# Patient Record
Sex: Male | Born: 1940 | Race: White | Hispanic: No | Marital: Married | Smoking: Former smoker
Health system: Southern US, Community
[De-identification: ages and names within clinical notes are randomized; demographics above are authoritative.]

## PROBLEM LIST (undated history)

## (undated) DIAGNOSIS — D497 Neoplasm of unspecified behavior of endocrine glands and other parts of nervous system: Secondary | ICD-10-CM

## (undated) DIAGNOSIS — E8881 Metabolic syndrome: Secondary | ICD-10-CM

## (undated) DIAGNOSIS — M5136 Other intervertebral disc degeneration, lumbar region: Secondary | ICD-10-CM

## (undated) DIAGNOSIS — E1140 Type 2 diabetes mellitus with diabetic neuropathy, unspecified: Secondary | ICD-10-CM

## (undated) DIAGNOSIS — Z9081 Acquired absence of spleen: Secondary | ICD-10-CM

## (undated) DIAGNOSIS — I480 Paroxysmal atrial fibrillation: Secondary | ICD-10-CM

## (undated) DIAGNOSIS — R4189 Other symptoms and signs involving cognitive functions and awareness: Secondary | ICD-10-CM

## (undated) DIAGNOSIS — I872 Venous insufficiency (chronic) (peripheral): Secondary | ICD-10-CM

## (undated) DIAGNOSIS — I82A22 Chronic embolism and thrombosis of left axillary vein: Secondary | ICD-10-CM

## (undated) DIAGNOSIS — E66811 Obesity, class 1: Secondary | ICD-10-CM

## (undated) DIAGNOSIS — M869 Osteomyelitis, unspecified: Secondary | ICD-10-CM

## (undated) DIAGNOSIS — N289 Disorder of kidney and ureter, unspecified: Secondary | ICD-10-CM

## (undated) DIAGNOSIS — G4730 Sleep apnea, unspecified: Secondary | ICD-10-CM

## (undated) DIAGNOSIS — J449 Chronic obstructive pulmonary disease, unspecified: Secondary | ICD-10-CM

## (undated) DIAGNOSIS — Z87898 Personal history of other specified conditions: Secondary | ICD-10-CM

## (undated) DIAGNOSIS — E669 Obesity, unspecified: Secondary | ICD-10-CM

## (undated) DIAGNOSIS — D803 Selective deficiency of immunoglobulin G [IgG] subclasses: Secondary | ICD-10-CM

## (undated) DIAGNOSIS — M1990 Unspecified osteoarthritis, unspecified site: Secondary | ICD-10-CM

## (undated) DIAGNOSIS — D849 Immunodeficiency, unspecified: Secondary | ICD-10-CM

## (undated) DIAGNOSIS — N400 Enlarged prostate without lower urinary tract symptoms: Secondary | ICD-10-CM

## (undated) DIAGNOSIS — T783XXA Angioneurotic edema, initial encounter: Secondary | ICD-10-CM

## (undated) DIAGNOSIS — I10 Essential (primary) hypertension: Secondary | ICD-10-CM

## (undated) DIAGNOSIS — M51369 Other intervertebral disc degeneration, lumbar region without mention of lumbar back pain or lower extremity pain: Secondary | ICD-10-CM

## (undated) HISTORY — PX: HERNIA REPAIR: SHX51

## (undated) HISTORY — PX: TONSILLECTOMY: SUR1361

## (undated) HISTORY — DX: Disorder of kidney and ureter, unspecified: N28.9

## (undated) HISTORY — DX: Angioneurotic edema, initial encounter: T78.3XXA

## (undated) HISTORY — DX: Chronic obstructive pulmonary disease, unspecified: J44.9

## (undated) HISTORY — DX: Type 2 diabetes mellitus with diabetic neuropathy, unspecified: E11.40

## (undated) HISTORY — DX: Paroxysmal atrial fibrillation: I48.0

## (undated) HISTORY — DX: Osteomyelitis, unspecified: M86.9

## (undated) HISTORY — PX: APPENDECTOMY: SHX54

## (undated) HISTORY — DX: Other symptoms and signs involving cognitive functions and awareness: R41.89

## (undated) HISTORY — DX: Obesity, class 1: E66.811

## (undated) HISTORY — PX: PAIN PUMP IMPLANTATION: SHX330

## (undated) HISTORY — DX: Unspecified osteoarthritis, unspecified site: M19.90

## (undated) HISTORY — DX: Neoplasm of unspecified behavior of endocrine glands and other parts of nervous system: D49.7

## (undated) HISTORY — DX: Sleep apnea, unspecified: G47.30

## (undated) HISTORY — PX: LUMBAR DISC SURGERY: SHX700

## (undated) HISTORY — DX: Benign prostatic hyperplasia without lower urinary tract symptoms: N40.0

## (undated) HISTORY — DX: Immunodeficiency, unspecified: D84.9

## (undated) HISTORY — PX: TEMPORAL ARTERY BIOPSY / LIGATION: SUR132

## (undated) HISTORY — DX: Other intervertebral disc degeneration, lumbar region without mention of lumbar back pain or lower extremity pain: M51.369

## (undated) HISTORY — PX: GALLBLADDER SURGERY: SHX652

## (undated) HISTORY — DX: Chronic embolism and thrombosis of left axillary vein: I82.A22

## (undated) HISTORY — DX: Metabolic syndrome: E88.810

## (undated) HISTORY — DX: Metabolic syndrome: E88.81

## (undated) HISTORY — DX: Obesity, unspecified: E66.9

## (undated) HISTORY — DX: Selective deficiency of immunoglobulin g (igg) subclasses: D80.3

## (undated) HISTORY — DX: Other intervertebral disc degeneration, lumbar region: M51.36

## (undated) HISTORY — DX: Essential (primary) hypertension: I10

## (undated) HISTORY — DX: Personal history of other specified conditions: Z87.898

## (undated) HISTORY — DX: Venous insufficiency (chronic) (peripheral): I87.2

## (undated) HISTORY — PX: SPLENECTOMY, TOTAL: SHX788

## (undated) HISTORY — DX: Acquired absence of spleen: Z90.81

---

## 2015-09-21 DIAGNOSIS — M869 Osteomyelitis, unspecified: Secondary | ICD-10-CM

## 2015-09-21 DIAGNOSIS — I82A22 Chronic embolism and thrombosis of left axillary vein: Secondary | ICD-10-CM

## 2015-09-21 HISTORY — DX: Osteomyelitis, unspecified: M86.9

## 2015-09-21 HISTORY — DX: Chronic embolism and thrombosis of left axillary vein: I82.A22

## 2017-09-20 HISTORY — PX: TRANSTHORACIC ECHOCARDIOGRAM: SHX275

## 2017-09-20 HISTORY — PX: NM MYOVIEW LTD: HXRAD82

## 2018-03-07 ENCOUNTER — Encounter: Payer: Self-pay | Admitting: Cardiology

## 2018-05-26 ENCOUNTER — Ambulatory Visit: Payer: Medicare Other | Admitting: Allergy

## 2018-05-29 ENCOUNTER — Ambulatory Visit: Payer: Medicare Other | Admitting: Allergy and Immunology

## 2018-06-06 ENCOUNTER — Ambulatory Visit: Payer: Medicare Other | Admitting: Allergy and Immunology

## 2018-06-07 ENCOUNTER — Ambulatory Visit (INDEPENDENT_AMBULATORY_CARE_PROVIDER_SITE_OTHER): Payer: Medicare Other | Admitting: Allergy & Immunology

## 2018-06-07 ENCOUNTER — Encounter: Payer: Self-pay | Admitting: Allergy & Immunology

## 2018-06-07 VITALS — BP 166/80 | HR 74 | Temp 97.9°F | Resp 20 | Ht 63.0 in | Wt 214.0 lb

## 2018-06-07 DIAGNOSIS — J449 Chronic obstructive pulmonary disease, unspecified: Secondary | ICD-10-CM

## 2018-06-07 DIAGNOSIS — J310 Chronic rhinitis: Secondary | ICD-10-CM | POA: Insufficient documentation

## 2018-06-07 DIAGNOSIS — D803 Selective deficiency of immunoglobulin G [IgG] subclasses: Secondary | ICD-10-CM | POA: Diagnosis not present
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
## 2018-06-07 NOTE — Patient Instructions (Signed)
1. IgG1 subclass deficiency - We will get an IgG level today as well as a complete blood count today.  - We will work with [place] (one of our staff members who manages the infusions) to get you immunoglobulin since it has been so long.  - She will reach out to you tomorrow to get you set up with IVIG (hopefully we can do this soon).  2. Chronic obstructive pulmonary disease - Lung function looked somewhat terrible today. - We will continue with Trelegy one puff once daily. - You definitely need to see a Pulmonologist, but I will make sure that [facility] has referred you.   3. Chronic rhinitis - We will get labs to look for environmental allergies. - We will call you in 1-2 weeks with the results of the testing.  - Try using Nasacort 1-2 sprays per nostril daily.   4. Return in about 3 months (around 09/06/2018).   We will also talk to [facility] to make sure that all of your referrals have been made. We need to get you established with specialists.      Please inform us of any Emergency Department visits, hospitalizations, or changes in symptoms. Call us before going to the ED for breathing or allergy symptoms since we might be able to fit you in for a sick visit. Feel free to contact us anytime with any questions, problems, or concerns.  It was a pleasure to meet you and your family today!  Websites that have reliable patient information: 1. American Academy of Asthma, Allergy, and Immunology: www.aaaai.org 2. Food Allergy Research and Education (FARE): foodallergy.org 3. Mothers of Asthmatics: http://www.asthmacommunitynetwork.org 4. American College of Allergy, Asthma, and Immunology: MonthlyElectricBill.co.uk   Make sure you are registered to vote! If you have moved or changed any of your contact information, you will need to get this updated before voting!

## 2018-06-08 NOTE — Progress Notes (Addendum)
Lelan Pons, one of our front desk workers, talked to you [employer].  None of the referrals to his subspecialist have been made aside from myself and orthopedic surgery.  For the good of the patient's health, I think we need to get him tied into specialist here since he moved here from [place].  I would recommend that Dr. Keenan Bachelor make referrals to Cardiology, Neurology, Pulmonology, and Podiatry.  He saw all of the specialists in [place] and for continuity of care needs to see them here in [place].  Previous Cardiologist: Dr. Legrand Como [facility]) Previous Neurologist: Dr. Dietrich Pates ([place]) Previous Pulmonologist: Dr. Shelton Silvas ([place]) Previous Podiatrist: Dr. Clyde Canterbury ([place])    Salvatore Marvel, MD [facility]

## 2018-06-08 NOTE — Progress Notes (Signed)
FAXED note to Dr Keenan Bachelor Attached list of [facility] special[facility] providers for her to refer to for the patient to establish care with

## 2018-06-10 ENCOUNTER — Encounter (HOSPITAL_COMMUNITY): Payer: Self-pay | Admitting: Nurse Practitioner

## 2018-06-10 ENCOUNTER — Emergency Department (HOSPITAL_COMMUNITY)
Admission: EM | Admit: 2018-06-10 | Discharge: 2018-06-11 | Disposition: A | Discharge status: Home / Self Care | Payer: Medicare Other | Attending: Emergency Medicine | Admitting: Emergency Medicine

## 2018-06-10 DIAGNOSIS — R109 Unspecified abdominal pain: Secondary | ICD-10-CM

## 2018-06-10 DIAGNOSIS — J449 Chronic obstructive pulmonary disease, unspecified: Secondary | ICD-10-CM | POA: Insufficient documentation

## 2018-06-10 DIAGNOSIS — Z79899 Other long term (current) drug therapy: Secondary | ICD-10-CM | POA: Insufficient documentation

## 2018-06-10 DIAGNOSIS — Z87891 Personal history of nicotine dependence: Secondary | ICD-10-CM | POA: Insufficient documentation

## 2018-06-10 DIAGNOSIS — R112 Nausea with vomiting, unspecified: Secondary | ICD-10-CM | POA: Insufficient documentation

## 2018-06-10 DIAGNOSIS — Z7901 Long term (current) use of anticoagulants: Secondary | ICD-10-CM | POA: Insufficient documentation

## 2018-06-10 DIAGNOSIS — R42 Dizziness and giddiness: Secondary | ICD-10-CM

## 2018-06-10 DIAGNOSIS — Z794 Long term (current) use of insulin: Secondary | ICD-10-CM | POA: Diagnosis not present

## 2018-06-10 LAB — COMPREHENSIVE METABOLIC PANEL
ALBUMIN: 2.9 g/dL — AB (ref 3.5–5.0)
ALT: 15 U/L (ref 0–44)
AST: 18 U/L (ref 15–41)
Alkaline Phosphatase: 82 U/L (ref 38–126)
Anion gap: 11 (ref 5–15)
BUN: 39 mg/dL — ABNORMAL HIGH (ref 8–23)
CALCIUM: 9 mg/dL (ref 8.9–10.3)
CHLORIDE: 103 mmol/L (ref 98–111)
CO2: 25 mmol/L (ref 22–32)
Creatinine, Ser: 2.01 mg/dL — ABNORMAL HIGH (ref 0.61–1.24)
GFR calc non Af Amer: 31 mL/min — ABNORMAL LOW (ref >60)
GFR, EST AFRICAN AMERICAN: 35 mL/min — AB (ref >60)
Glucose, Bld: 192 mg/dL — ABNORMAL HIGH (ref 70–99)
Potassium: 4.4 mmol/L (ref 3.5–5.1)
SODIUM: 139 mmol/L (ref 135–145)
Total Bilirubin: 0.4 mg/dL (ref 0.3–1.2)
Total Protein: 6.3 g/dL — ABNORMAL LOW (ref 6.5–8.1)

## 2018-06-10 LAB — URINALYSIS, ROUTINE W REFLEX MICROSCOPIC
BILIRUBIN URINE: NEGATIVE
Bacteria, UA: NONE SEEN
Glucose, UA: 50 mg/dL — AB
HGB URINE DIPSTICK: NEGATIVE
KETONES UR: NEGATIVE mg/dL
LEUKOCYTES UA: NEGATIVE
Nitrite: NEGATIVE
Protein, ur: 100 mg/dL — AB
Specific Gravity, Urine: 1.008 (ref 1.005–1.030)
pH: 6 (ref 5.0–8.0)

## 2018-06-10 LAB — CBC
HCT: 33.3 % — ABNORMAL LOW (ref 39.0–52.0)
Hemoglobin: 10.9 g/dL — ABNORMAL LOW (ref 13.0–17.0)
MCH: 32.5 pg (ref 26.0–34.0)
MCHC: 32.7 g/dL (ref 30.0–36.0)
MCV: 99.4 fL (ref 78.0–100.0)
PLATELETS: 287 10*3/uL (ref 150–400)
RBC: 3.35 MIL/uL — AB (ref 4.22–5.81)
RDW: 15.5 % (ref 11.5–15.5)
WBC: 11.8 10*3/uL — ABNORMAL HIGH (ref 4.0–10.5)

## 2018-06-10 NOTE — ED Triage Notes (Signed)
Pt presents from [facility] [place], he is c/o abdominal pain, dizziness, nausea vomiting that he states started about an hour ago when he ate fish. Denies any other symptoms

## 2018-06-10 NOTE — ED Notes (Signed)
Bed: UP73 Expected date:  Expected time:  Means of arrival:  Comments: 77 yo M/ ABD PAIN

## 2018-06-11 ENCOUNTER — Encounter (HOSPITAL_COMMUNITY): Payer: Self-pay | Admitting: Nurse Practitioner

## 2018-06-11 ENCOUNTER — Emergency Department (HOSPITAL_COMMUNITY): Payer: Medicare Other

## 2018-06-11 DIAGNOSIS — R112 Nausea with vomiting, unspecified: Secondary | ICD-10-CM | POA: Diagnosis not present

## 2018-06-11 LAB — I-STAT CHEM 8, ED
BUN: 48 mg/dL — ABNORMAL HIGH (ref 8–23)
CREATININE: 1.9 mg/dL — AB (ref 0.61–1.24)
Calcium, Ion: 1.18 mmol/L (ref 1.15–1.40)
Chloride: 102 mmol/L (ref 98–111)
Glucose, Bld: 192 mg/dL — ABNORMAL HIGH (ref 70–99)
HEMATOCRIT: 33 % — AB (ref 39.0–52.0)
HEMOGLOBIN: 11.2 g/dL — AB (ref 13.0–17.0)
Potassium: 5.2 mmol/L — ABNORMAL HIGH (ref 3.5–5.1)
Sodium: 135 mmol/L (ref 135–145)
TCO2: 27 mmol/L (ref 22–32)

## 2018-06-11 LAB — I-STAT TROPONIN, ED: Troponin i, poc: 0.01 ng/mL (ref 0.00–0.08)

## 2018-06-11 MED ORDER — MORPHINE SULFATE (PF) 4 MG/ML IV SOLN
4.0000 mg | Freq: Once | INTRAVENOUS | Status: AC
  Administered 2018-06-11: 4 mg via INTRAVENOUS
  Filled 2018-06-11: qty 1

## 2018-06-11 MED ORDER — IOPAMIDOL (ISOVUE-300) INJECTION 61%
INTRAVENOUS | Status: AC
  Filled 2018-06-11: qty 100

## 2018-06-11 MED ORDER — SODIUM CHLORIDE 0.9 % IV BOLUS
250.0000 mL | Freq: Once | INTRAVENOUS | Status: AC
  Administered 2018-06-11: 250 mL via INTRAVENOUS

## 2018-06-11 MED ORDER — IOPAMIDOL (ISOVUE-300) INJECTION 61%
80.0000 mL | Freq: Once | INTRAVENOUS | Status: AC | PRN
  Administered 2018-06-11: 80 mL via INTRAVENOUS

## 2018-06-11 MED ORDER — ONDANSETRON 4 MG PO TBDP: 4.0000 mg | ORAL_TABLET | Freq: Three times a day (TID) | ORAL | 0 refills | Status: DC | PRN

## 2018-06-11 MED ORDER — SODIUM CHLORIDE 0.9 % IJ SOLN
INTRAMUSCULAR | Status: AC
  Filled 2018-06-11: qty 50

## 2018-06-11 MED ORDER — ONDANSETRON HCL 4 MG/2ML IJ SOLN
4.0000 mg | Freq: Once | INTRAMUSCULAR | Status: AC
  Administered 2018-06-11: 4 mg via INTRAVENOUS
  Filled 2018-06-11: qty 2

## 2018-06-11 NOTE — ED Notes (Signed)
Patient transported to CT 

## 2018-06-11 NOTE — ED Notes (Signed)
Pt gave permission to sign for him

## 2018-06-11 NOTE — Discharge Instructions (Addendum)
No emergent cause of vomiting or abdominal pain was found.  If the symptoms worsen, or you have bloody vomit/diarrhea, or fever please return to the ER.  You CT has some concerning findings for possible metastatic disease.  Perhaps stemming from the mass in your leg.  You need to see a cancer doctor.  Please contact the provider listed.   IMPRESSION: 1. No acute intra-abdominal or pelvic pathology. 2. Colonic diverticulosis. No bowel obstruction or active inflammation. 3. Small nodular densities along the inferior surface of the left hemidiaphragm may represent lymph nodes, or residual splenic tissue. Metastatic implants are not excluded. Correlation with history of known malignancy recommended. 4. Large fat containing lesion in the medial musculature of the left proximal thigh. Further characterization with MRI is recommended.

## 2018-06-11 NOTE — ED Provider Notes (Signed)
[facility] DEPT Provider Note   CSN: [id number] Arrival date & time: 06/10/18  2235     History   Chief Complaint Chief Complaint  Patient presents with  . Dizziness  . Emesis  . Nausea    HPI Jeffery [name] is a 77 y.o. male.  Patient presents to the emergency department with a chief complaint of nausea, vomiting, dizziness.  He states that a couple of hours ago he felt dizzy and vomited.  He states that the dizziness is resolved.  He reports having some abdominal pain.  He reports multiple prior abdominal surgeries.  He denies any chest pain or shortness of breath.  Denies any fever, chills, cough.  He states that he ate fish for dinner tonight.  He has had Zofran by EMS.  He still reports feeling nauseated and having some left-sided abdominal pain.  The history is provided by the patient. No language interpreter was used.    Past Medical History:  Diagnosis Date  . Angio-edema     Patient Active Problem List   Diagnosis Date Noted  . 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 Chronic rhinitis 06/07/2018    Past Surgical History:  Procedure Laterality Date  . APPENDECTOMY    . GALLBLADDER SURGERY    . HERNIA REPAIR    . PAIN PUMP IMPLANTATION    . SPLENECTOMY, TOTAL    . TONSILLECTOMY          Home Medications    Prior to Admission medications   Medication Sig Start Date End Date Taking? Authorizing Provider  allopurinol (ZYLOPRIM) 300 MG tablet Take 300 mg by mouth daily.  05/10/18   [provider]  amLODipine (NORVASC) 5 MG tablet Take 5 mg by mouth daily.  04/08/18   [provider]  atorvastatin (LIPITOR) 10 MG tablet Take 10 mg by mouth daily.  04/07/18   [provider]  calcitRIOL (ROCALTROL) 0.25 MCG capsule Take 0.25 mcg by mouth daily.  03/31/18   [provider]  Docusate Sodium (COLACE PO) Take 100 mg by mouth 2 (two) times daily  as needed.     [provider]  donepezil (ARICEPT) 10 MG tablet Take 10 mg by mouth at bedtime.  04/23/18   [provider]  finasteride (PROSCAR) 5 MG tablet Take 5 mg by mouth daily.  03/09/18   [provider]  gabapentin (NEURONTIN) 300 MG capsule Take 300 mg by mouth 3 (three) times daily.  05/29/18   [provider]  GAMMAGARD 20 GM/200ML SOLN  05/01/18   [provider]  hydrochlorothiazide (MICROZIDE) 12.5 MG capsule Take 12.5 mg by mouth daily.  04/21/18   [provider]  hyoscyamine (LEVSIN, ANASPAZ) 0.125 MG tablet Take 0.125 mg by mouth every 6 (six) hours as needed.  04/07/18   [provider]  insulin glargine (LANTUS) 100 UNIT/ML injection Inject 30 Units into the skin every morning.    [provider]  insulin lispro (HUMALOG KWIKPEN) 100 UNIT/ML KiwkPen Inject into the skin See admin instructions. Sliding scale. 1-4 times daily.    [provider]  losartan (COZAAR) 100 MG tablet Take 100 mg by mouth daily.  04/21/18   [provider]  methocarbamol (ROBAXIN) 500 MG tablet  04/07/18   [provider]  metoprolol succinate (TOPROL-XL) 25 MG 24 hr tablet Take 25 mg by mouth daily.  03/09/18   [provider]  montelukast (SINGULAIR) 10  MG tablet Take 10 mg by mouth at bedtime.    [provider]  oxyCODONE (ROXICODONE) 15 MG immediate release tablet Take 15 mg by mouth every 6 (six) hours as needed.  05/10/18   [provider]  OXYCONTIN 15 MG 12 hr tablet Take 15 mg by mouth every 12 (twelve) hours.  05/10/18   [provider]  ranitidine (ZANTAC) 150 MG tablet  03/24/18   [provider]  tamsulosin (FLOMAX) 0.4 MG CAPS capsule Take 0.4 mg by mouth daily.  04/07/18   [provider]  XARELTO 15 MG TABS tablet  04/07/18   [provider]    Family History History reviewed. No pertinent family history.  Social History Social History    Tobacco Use  . Smoking status: Former Smoker    Last attempt to quit: 06/07/1988    Years since quitting: 30.0  . Smokeless tobacco: Never Used  Substance Use Topics  . Alcohol use: Not on file  . Drug use: Not on file     Allergies   Betadine [povidone iodine]; Keflex [cephalexin]; and Penicillins   Review of Systems Review of Systems  All other systems reviewed and are negative.    Physical Exam Updated Vital Signs BP (!) 151/72 (BP Location: Right Arm)   Pulse 68   Temp 98.4 F (36.9 C) (Oral)   Resp 14   SpO2 96%   Physical Exam  Constitutional: He is oriented to person, place, and time. He appears well-developed and well-nourished.  HENT:  Head: Normocephalic and atraumatic.  Eyes: Pupils are equal, round, and reactive to light. Conjunctivae and EOM are normal. Right eye exhibits no discharge. Left eye exhibits no discharge. No scleral icterus.  Neck: Normal range of motion. Neck supple. No JVD present.  Cardiovascular: Normal rate, regular rhythm and normal heart sounds. Exam reveals no gallop and no friction rub.  No murmur heard. Pulmonary/Chest: Effort normal and breath sounds normal. No respiratory distress. He has no wheezes. He has no rales. He exhibits no tenderness.  Abdominal: Soft. He exhibits no distension and no mass. There is tenderness. There is no rebound and no guarding.  Musculoskeletal: Normal range of motion. He exhibits no edema or tenderness.  Neurological: He is alert and oriented to person, place, and time.  Skin: Skin is warm and dry.  Psychiatric: He has a normal mood and affect. His behavior is normal. Judgment and thought content normal.  Nursing note and vitals reviewed.    ED Treatments / Results  Labs (all labs ordered are listed, but only abnormal results are displayed) Labs Reviewed  CBC - Abnormal; Notable for the following components:      Result Value   WBC 11.8 (*)    RBC 3.35 (*)    Hemoglobin 10.9 (*)    HCT 33.3  (*)    All other components within normal limits  URINALYSIS, ROUTINE W REFLEX MICROSCOPIC - Abnormal; Notable for the following components:   Color, Urine STRAW (*)    Glucose, UA 50 (*)    Protein, ur 100 (*)    All other components within normal limits  COMPREHENSIVE METABOLIC PANEL - Abnormal; Notable for the following components:   Glucose, Bld 192 (*)    BUN 39 (*)    Creatinine, Ser 2.01 (*)    Total Protein 6.3 (*)    Albumin 2.9 (*)    GFR calc non Af Amer 31 (*)    GFR calc Af Amer 35 (*)  All other components within normal limits  I-STAT CHEM 8, ED  I-STAT TROPONIN, ED    EKG None  Radiology Ct Abdomen Pelvis W Contrast  Result Date: 06/11/2018 CLINICAL DATA:  77 year old male with abdominal pain. EXAM: CT ABDOMEN AND PELVIS WITH CONTRAST TECHNIQUE: Multidetector CT imaging of the abdomen and pelvis was performed using the standard protocol following bolus administration of intravenous contrast. CONTRAST:  27mL ISOVUE-300 IOPAMIDOL (ISOVUE-300) INJECTION 61% COMPARISON:  None. FINDINGS: Lower chest: Bilateral posterior pleural thickening or subpleural scarring. There is mild bronchiectatic changes of the visualized lung bases as well as diffuse interstitial prominence and thickening. Coronary vascular calcifications noted. No intra-abdominal free air or free fluid. Hepatobiliary: Cholecystectomy. The liver is unremarkable. There is mild intrahepatic biliary ductal dilatation, likely post cholecystectomy. No retained calcified stone noted in the central CBD. Pancreas: Unremarkable. No pancreatic ductal dilatation or surrounding inflammatory changes. Spleen: Splenectomy. Adrenals/Urinary Tract: The adrenal glands are unremarkable. Multiple small left renal hypodense lesions measure up to 2 cm in the interpolar aspect of the left kidney. These lesions are suboptimally characterized but the larger lesions demonstrate fluid attenuation most consistent with cysts and the smaller  lesions are too small to characterize. There is no hydronephrosis on either side. The visualized ureters and urinary bladder appear unremarkable. Stomach/Bowel: There is sigmoid diverticulosis without active inflammatory changes. Moderate stool noted throughout the colon. There is no bowel obstruction or active inflammation. Appendectomy. Thickened appearance of the rectum likely related to underdistention. No associated inflammatory changes. Vascular/Lymphatic: Advanced aortoiliac atherosclerotic disease. The IVC is unremarkable. No portal venous gas. Top-normal retroperitoneal lymph nodes no adenopathy. Small nodular density along the surface of the left hemidiaphragm measure up to 10 mm (coronal series 5, image 60) are indeterminate but may represent small lymph nodes or splenic tissue. If there is history of known malignancy, metastatic implants are not excluded. Reproductive: The prostate and seminal vesicles are grossly unremarkable. Other: There is a 6 x 10 cm partially visualized fat containing lesion in the left pelvic musculature. This is incompletely characterized. Further characterization with MRI without and with contrast on a nonemergent basis recommended. A generator device is noted in the subcutaneous soft tissues of the anterior abdominal wall. There is a large pannus containing loops of small bowel without evidence of obstruction. Musculoskeletal: Osteopenia with multilevel degenerative changes of the spine most prominent at T12-L2. No acute osseous pathology. IMPRESSION: 1. No acute intra-abdominal or pelvic pathology. 2. Colonic diverticulosis. No bowel obstruction or active inflammation. 3. Small nodular densities along the inferior surface of the left hemidiaphragm may represent lymph nodes, or residual splenic tissue. Metastatic implants are not excluded. Correlation with history of known malignancy recommended. 4. Large fat containing lesion in the medial musculature of the left proximal  thigh. Further characterization with MRI is recommended. Electronically Signed   By: Anner Crete M.D.   On: 06/11/2018 02:11    Procedures Procedures (including critical care time)  Medications Ordered in ED Medications  sodium chloride 0.9 % bolus 250 mL (has no administration in time range)  ondansetron (ZOFRAN) injection 4 mg (has no administration in time range)  morphine 4 MG/ML injection 4 mg (has no administration in time range)     Initial Impression / Assessment and Plan / ED Course  I have reviewed the triage vital signs and the nursing notes.  Pertinent labs & imaging results that were available during my care of the patient were reviewed by me and considered in my medical decision making (see  chart for details).     Patient with abdominal pain, nausea, vomiting.  States that he had a brief episode of dizziness immediately prior to the episode of vomiting.  He has no dizziness now.  He does have some tenderness on exam, and has had multiple prior abdominal surgeries, given age and risk factors will proceed with CT imaging.  CT shows no acute intra-abdominal process, but there is some concern for metastatic disease.  Patient also has known hernias.  I informed the patient of the results, and he states that he was in the middle of an evaluation for a mass in his left thigh, but ended up moving to [place], and has not reestablished care.  I discussed the need to follow-up with oncology urgently.  Patient understands and agrees with the plan.  Patient discussed with Dr. Randal Buba, who agrees with plan.  Final Clinical Impressions(s) / ED Diagnoses   Final diagnoses:  Nausea and vomiting, intractability of vomiting not specified, unspecified vomiting type  Dizziness  Abdominal pain, unspecified abdominal location    ED Discharge Orders    None       Montine Circle, PA-C 06/11/18 0244    Palumbo, April, MD 06/11/18 [phone]

## 2018-06-12 LAB — CBC WITH DIFFERENTIAL/PLATELET
Basophils Absolute: 0.1 10*3/uL (ref 0.0–0.2)
Basos: 0 %
EOS (ABSOLUTE): 0.3 10*3/uL (ref 0.0–0.4)
EOS: 2 %
HEMATOCRIT: 35 % — AB (ref 37.5–51.0)
Hemoglobin: 11.6 g/dL — ABNORMAL LOW (ref 13.0–17.7)
IMMATURE GRANULOCYTES: 1 %
Immature Grans (Abs): 0.1 10*3/uL (ref 0.0–0.1)
Lymphocytes Absolute: 1.5 10*3/uL (ref 0.7–3.1)
Lymphs: 9 %
MCH: 32 pg (ref 26.6–33.0)
MCHC: 33.1 g/dL (ref 31.5–35.7)
MCV: 96 fL (ref 79–97)
MONOS ABS: 1.4 10*3/uL — AB (ref 0.1–0.9)
Monocytes: 9 %
NEUTROS PCT: 79 %
Neutrophils Absolute: 13 10*3/uL — ABNORMAL HIGH (ref 1.4–7.0)
Platelets: 296 10*3/uL (ref 150–450)
RBC: 3.63 x10E6/uL — AB (ref 4.14–5.80)
RDW: 14.2 % (ref 12.3–15.4)
WBC: 16.4 10*3/uL — AB (ref 3.4–10.8)

## 2018-06-12 LAB — CMP14+EGFR
A/G RATIO: 1.3 (ref 1.2–2.2)
ALK PHOS: 110 IU/L (ref 39–117)
ALT: 13 IU/L (ref 0–44)
AST: 15 IU/L (ref 0–40)
Albumin: 3.4 g/dL — ABNORMAL LOW (ref 3.5–4.8)
BUN/Creatinine Ratio: 17 (ref 10–24)
BUN: 31 mg/dL — ABNORMAL HIGH (ref 8–27)
Bilirubin Total: <0.2 mg/dL (ref 0.0–1.2)
CO2: 23 mmol/L (ref 20–29)
CREATININE: 1.8 mg/dL — AB (ref 0.76–1.27)
Calcium: 9.2 mg/dL (ref 8.6–10.2)
Chloride: 100 mmol/L (ref 96–106)
GFR calc Af Amer: 41 mL/min/{1.73_m2} — ABNORMAL LOW (ref >59)
GFR, EST NON AFRICAN AMERICAN: 36 mL/min/{1.73_m2} — AB (ref >59)
Globulin, Total: 2.7 g/dL (ref 1.5–4.5)
Glucose: 234 mg/dL — ABNORMAL HIGH (ref 65–99)
POTASSIUM: 4.4 mmol/L (ref 3.5–5.2)
SODIUM: 139 mmol/L (ref 134–144)
Total Protein: 6.1 g/dL (ref 6.0–8.5)

## 2018-06-12 LAB — IGE+ALLERGENS ZONE 2(30)
Alternaria Alternata IgE: <0.1 kU/L
Amer Sycamore IgE Qn: <0.1 kU/L
Bermuda Grass IgE: <0.1 kU/L
Cedar, Mountain IgE: <0.1 kU/L
Cladosporium Herbarum IgE: <0.1 kU/L
Cockroach, American IgE: <0.1 kU/L
D Farinae IgE: <0.1 kU/L
D Pteronyssinus IgE: <0.1 kU/L
Dog Dander IgE: <0.1 kU/L
Hickory, White IgE: <0.1 kU/L
IgE (Immunoglobulin E), Serum: 144 IU/mL (ref 6–495)
Maple/Box Elder IgE: <0.1 kU/L
Nettle IgE: <0.1 kU/L
Oak, White IgE: <0.1 kU/L
Penicillium Chrysogen IgE: <0.1 kU/L
Pigweed, Rough IgE: <0.1 kU/L
Sheep Sorrel IgE Qn: <0.1 kU/L

## 2018-06-12 LAB — IGG: IGG (IMMUNOGLOBIN G), SERUM: 680 mg/dL — AB (ref 700–1600)

## 2018-06-13 ENCOUNTER — Telehealth: Payer: Self-pay | Admitting: Allergy & Immunology

## 2018-06-13 ENCOUNTER — Telehealth: Payer: Self-pay | Admitting: *Deleted

## 2018-06-13 ENCOUNTER — Encounter: Payer: Self-pay | Admitting: *Deleted

## 2018-06-13 NOTE — Telephone Encounter (Signed)
Are lab results back?? Please call and advise pateint

## 2018-06-13 NOTE — Telephone Encounter (Signed)
I had it arranged for patient to get IVIG in home and when I spoke to patient and explained same previously he did not mention that he did not want to get in home infusion.  Upon nurse reaching out to patient he advised her that he was adamant about getting infusion in hospital.   I called and scheduled patient for infusion on first Monday or Wednesday (due to transportation) available which will be October 2 at 8am.  I did contact patient and advised appt and I will mail him reminder.  I also had to advised him I contacted [facility] and they are unable to obtain the Gammagard he was previously on so he will be getting Privigen instead.

## 2018-06-13 NOTE — Telephone Encounter (Signed)
That is fine. That Gammagard lady came by last week and made a big deal out of it being on formulary for [facility]. Oh well!   Salvatore Marvel, MD [facility] [place] [facility] [place]

## 2018-06-13 NOTE — Telephone Encounter (Signed)
Sorry did not advise lab tests do the issues of trying to get him started back on IVIG

## 2018-06-13 NOTE — Telephone Encounter (Signed)
Left detailed message requesting call back from wife or patient to discuss results.

## 2018-06-13 NOTE — Telephone Encounter (Signed)
Jeffery [name], I checked lab notes and am not sure if you spoke with him regarding his test results. Please advise and thank you.

## 2018-06-14 NOTE — Telephone Encounter (Signed)
I spoke to husband and advised him of this information. He then requested I tell the same to his wife, which I did. She states that the patient has an appointment with pcp (dr who makes house calls) this Friday. He is set up to get an infusion 06-21-2018.

## 2018-06-14 NOTE — Telephone Encounter (Signed)
FYI

## 2018-06-20 ENCOUNTER — Other Ambulatory Visit (HOSPITAL_COMMUNITY): Payer: Self-pay | Admitting: *Deleted

## 2018-06-21 ENCOUNTER — Encounter: Payer: Self-pay | Admitting: Allergy & Immunology

## 2018-06-21 ENCOUNTER — Ambulatory Visit (HOSPITAL_COMMUNITY)
Admission: RE | Admit: 2018-06-21 | Discharge: 2018-06-21 | Disposition: A | Discharge status: Home / Self Care | Payer: Medicare Other | Source: Ambulatory Visit | Attending: Allergy & Immunology | Admitting: Allergy & Immunology

## 2018-06-21 DIAGNOSIS — D803 Selective deficiency of immunoglobulin G [IgG] subclasses: Secondary | ICD-10-CM | POA: Diagnosis present

## 2018-06-21 MED ORDER — DIPHENHYDRAMINE HCL 25 MG PO CAPS
ORAL_CAPSULE | ORAL | Status: AC
  Administered 2018-06-21: 25 mg
  Filled 2018-06-21: qty 1

## 2018-06-21 MED ORDER — IMMUNE GLOBULIN (HUMAN) 20 GM/200ML IV SOLN
30.0000 g | Freq: Once | INTRAVENOUS | Status: AC
  Administered 2018-06-21: 30 g via INTRAVENOUS
  Filled 2018-06-21: qty 100

## 2018-06-21 MED ORDER — ACETAMINOPHEN 325 MG PO TABS
ORAL_TABLET | ORAL | Status: AC
  Administered 2018-06-21: 650 mg
  Filled 2018-06-21: qty 2

## 2018-06-21 MED ORDER — DEXTROSE 5 % IV SOLN
Freq: Once | INTRAVENOUS | Status: AC
  Administered 2018-06-21: 09:00:00 via INTRAVENOUS

## 2018-06-21 MED ORDER — DIPHENHYDRAMINE HCL 25 MG PO CAPS: 25.0000 mg | ORAL_CAPSULE | Freq: Once | ORAL | Status: DC

## 2018-06-21 MED ORDER — ACETAMINOPHEN 325 MG PO TABS: 650.0000 mg | ORAL_TABLET | Freq: Once | ORAL | Status: DC

## 2018-07-06 NOTE — Progress Notes (Signed)
Patient needs a Monday or Wednesday appts due to transportation Will try to switch to tuesdays if possible

## 2018-07-10 ENCOUNTER — Other Ambulatory Visit: Payer: Self-pay | Admitting: Orthopedic Surgery

## 2018-07-10 DIAGNOSIS — M545 Low back pain, unspecified: Secondary | ICD-10-CM

## 2018-07-12 ENCOUNTER — Ambulatory Visit: Payer: BLUE CROSS/BLUE SHIELD | Admitting: Cardiology

## 2018-07-17 ENCOUNTER — Other Ambulatory Visit: Payer: BLUE CROSS/BLUE SHIELD

## 2018-07-18 ENCOUNTER — Other Ambulatory Visit (HOSPITAL_COMMUNITY): Payer: Self-pay | Admitting: *Deleted

## 2018-07-19 ENCOUNTER — Encounter (HOSPITAL_COMMUNITY): Payer: BLUE CROSS/BLUE SHIELD

## 2018-07-19 ENCOUNTER — Telehealth: Payer: Self-pay | Admitting: *Deleted

## 2018-07-19 ENCOUNTER — Ambulatory Visit (HOSPITAL_COMMUNITY)
Admission: RE | Admit: 2018-07-19 | Discharge: 2018-07-19 | Disposition: A | Discharge status: Home / Self Care | Payer: Medicare Other | Source: Ambulatory Visit | Attending: Allergy & Immunology | Admitting: Allergy & Immunology

## 2018-07-19 DIAGNOSIS — D803 Selective deficiency of immunoglobulin G [IgG] subclasses: Secondary | ICD-10-CM | POA: Diagnosis present

## 2018-07-19 MED ORDER — ACETAMINOPHEN 325 MG PO TABS
ORAL_TABLET | ORAL | Status: AC
  Filled 2018-07-19: qty 2

## 2018-07-19 MED ORDER — ACETAMINOPHEN 325 MG PO TABS
650.0000 mg | ORAL_TABLET | ORAL | Status: DC
  Administered 2018-07-19: 650 mg via ORAL

## 2018-07-19 MED ORDER — DIPHENHYDRAMINE HCL 25 MG PO CAPS
ORAL_CAPSULE | ORAL | Status: AC
  Filled 2018-07-19: qty 1

## 2018-07-19 MED ORDER — DIPHENHYDRAMINE HCL 25 MG PO CAPS
25.0000 mg | ORAL_CAPSULE | ORAL | Status: DC
  Administered 2018-07-19: 25 mg via ORAL

## 2018-07-19 MED ORDER — IMMUNE GLOBULIN (HUMAN) 20 GM/200ML IV SOLN
30.0000 g | INTRAVENOUS | Status: DC
  Administered 2018-07-19: 30 g via INTRAVENOUS
  Filled 2018-07-19: qty 100

## 2018-07-19 NOTE — Telephone Encounter (Signed)
Sonia Baller from [facility] short stay called an advised that patient there for his IVIG infusion and wife advised that after last infusion patient developed the shakes and after talking to MD advised this infusion we would order hydration.  I looked in Epic and spoke to Dr Ernst Bowler and we both had not hear anything regarding this from patient or wife after infusion.  Per Dr Ernst Bowler we will go ahead with the ordered IVIG and had patient drink water while getting infused and Sonia Baller will advised both that if he experiences any issues to contact our office so that we may know if he is having any issues and go from there

## 2018-07-20 ENCOUNTER — Telehealth: Payer: Self-pay | Admitting: Allergy & Immunology

## 2018-07-20 NOTE — Telephone Encounter (Signed)
Patient is getting sick after infusions He gets dehydrated, shakes, nauseated Can an order be put in to hydrate him during infusion??

## 2018-07-21 NOTE — Telephone Encounter (Signed)
I will go ahead and add to new order and send to [facility] for next infusion. I dont think they will go for SCIG since they were so opposed to even IVIG  In home but will keep that as option if he doesn't do well with next IVIG

## 2018-07-21 NOTE — Telephone Encounter (Signed)
Reviewed note.  We can certainly add hydration order to his premedication regimen (500 mL normal saline).  If this is so much of an issue, maybe we should more strongly consider subcutaneous immunoglobulin.  We could switch him to Cuvitru every 2 weeks which he might tolerate better.  I will route a message to Tammy to see what she thinks.  Salvatore Marvel, MD [facility] and [place] [facility] [place]

## 2018-07-25 NOTE — Telephone Encounter (Signed)
That is an excellent point. We will see how the hydration goes.  Salvatore Marvel, MD [facility] [place] [facility] [place]

## 2018-07-26 ENCOUNTER — Ambulatory Visit
Admission: RE | Admit: 2018-07-26 | Discharge: 2018-07-26 | Disposition: A | Discharge status: Home / Self Care | Payer: Medicare Other | Source: Ambulatory Visit | Attending: Orthopedic Surgery | Admitting: Orthopedic Surgery

## 2018-07-26 DIAGNOSIS — M545 Low back pain, unspecified: Secondary | ICD-10-CM

## 2018-07-27 ENCOUNTER — Encounter: Payer: Self-pay | Admitting: Gastroenterology

## 2018-08-02 ENCOUNTER — Encounter: Payer: Self-pay | Admitting: Gastroenterology

## 2018-08-10 ENCOUNTER — Ambulatory Visit (INDEPENDENT_AMBULATORY_CARE_PROVIDER_SITE_OTHER): Payer: Medicare Other | Admitting: Emergency Medicine

## 2018-08-10 ENCOUNTER — Encounter: Payer: Self-pay | Admitting: Emergency Medicine

## 2018-08-10 ENCOUNTER — Other Ambulatory Visit: Payer: Self-pay

## 2018-08-10 VITALS — BP 161/66 | HR 60 | Temp 98.0°F | Ht 68.0 in | Wt 208.2 lb

## 2018-08-10 DIAGNOSIS — R1084 Generalized abdominal pain: Secondary | ICD-10-CM | POA: Diagnosis not present

## 2018-08-10 DIAGNOSIS — Z7689 Persons encountering health services in other specified circumstances: Secondary | ICD-10-CM

## 2018-08-10 DIAGNOSIS — L989 Disorder of the skin and subcutaneous tissue, unspecified: Secondary | ICD-10-CM

## 2018-08-10 MED ORDER — DICYCLOMINE HCL 20 MG PO TABS: 20.0000 mg | ORAL_TABLET | Freq: Three times a day (TID) | ORAL | 1 refills | Status: DC | PRN

## 2018-08-10 NOTE — Patient Instructions (Addendum)
     If you have lab work done today you will be contacted with your lab results within the next 2 weeks.  If you have not heard from [place] then please contact us. The fastest way to get your results is to register for My Chart.   IF you received an x-ray today, you will receive an invoice from [facility]. Please contact [facility] at [phone] with questions or concerns regarding your invoice.   IF you received labwork today, you will receive an invoice from [place]. Please contact [facility] at [phone] with questions or concerns regarding your invoice.   Our billing staff will not be able to assist you with questions regarding bills from these companies.  You will be contacted with the lab results as soon as they are available. The fastest way to get your results is to activate your My Chart account. Instructions are located on the last page of this paperwork. If you have not heard from [place] regarding the results in 2 weeks, please contact this office.     Abdominal Pain, Adult Many things can cause belly (abdominal) pain. Most times, belly pain is not dangerous. Many cases of belly pain can be watched and treated at home. Sometimes belly pain is serious, though. Your doctor will try to find the cause of your belly pain. Follow these instructions at home:  Take over-the-counter and prescription medicines only as told by your doctor. Do not take medicines that help you poop (laxatives) unless told to by your doctor.  Drink enough fluid to keep your pee (urine) clear or pale yellow.  Watch your belly pain for any changes.  Keep all follow-up visits as told by your doctor. This is important. Contact a doctor if:  Your belly pain changes or gets worse.  You are not hungry, or you lose weight without trying.  You are having trouble pooping (constipated) or have watery poop (diarrhea) for more than 2-3 days.  You have pain when you pee or poop.  Your belly pain  wakes you up at night.  Your pain gets worse with meals, after eating, or with certain foods.  You are throwing up and cannot keep anything down.  You have a fever. Get help right away if:  Your pain does not go away as soon as your doctor says it should.  You cannot stop throwing up.  Your pain is only in areas of your belly, such as the right side or the left lower part of the belly.  You have bloody or black poop, or poop that looks like tar.  You have very bad pain, cramping, or bloating in your belly.  You have signs of not having enough fluid or water in your body (dehydration), such as: ? Dark pee, very little pee, or no pee. ? Cracked lips. ? Dry mouth. ? Sunken eyes. ? Sleepiness. ? Weakness. This information is not intended to replace advice given to you by your health care provider. Make sure you discuss any questions you have with your health care provider. Document Released: 02/23/2008 Document Revised: 03/26/2016 Document Reviewed: 02/18/2016 Elsevier Interactive Patient Education  2018 [employer].

## 2018-08-10 NOTE — Progress Notes (Signed)
Jeffery [name] 77 y.o.   Chief Complaint  Patient presents with  . Establish Care    body itchy and stomach pains    HISTORY OF PRESENT ILLNESS: This is a 77 y.o. male complaining generalized abdominal pain for the past couple days.  Also feels in the abdominal area.  Still eating and drinking.  Denies nausea vomiting.  Denies diarrhea.  Denies fever or chills.  Patient has multiple chronic medical problems including chronic pain.  First visit to this office. CT scan of pelvis done on 07/26/2018 showed the following: IMPRESSION: No acute intrapelvic abnormalities.  Distal colonic diverticulosis without evidence of diverticulitis.  Scattered atherosclerotic calcifications.  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 CT scan of abdomen and pelvis done on 06/11/2018 showed the following: IMPRESSION: 1. No acute intra-abdominal or pelvic pathology. 2. Colonic diverticulosis. No bowel obstruction or active inflammation. 3. Small nodular densities along the inferior surface of the left hemidiaphragm may represent lymph nodes, or residual splenic tissue. Metastatic implants are not excluded. Correlation with history of known malignancy recommended. 4. Large fat containing lesion in the medial musculature of the left proximal thigh. Further characterization with MRI is recommended.  HPI   Prior to Admission medications   Medication Sig Start Date End Date Taking? Authorizing Provider  allopurinol (ZYLOPRIM) 300 MG tablet Take 300 mg by mouth daily.  05/10/18  Yes [provider]  amLODipine (NORVASC) 5 MG tablet Take 10 mg by mouth daily.  04/08/18  Yes [provider]  atorvastatin (LIPITOR) 10 MG tablet Take 10 mg by mouth daily.  04/07/18  Yes [provider]  calcitRIOL (ROCALTROL) 0.25 MCG capsule Take 0.25 mcg  by mouth daily.  03/31/18  Yes [provider]  Docusate Sodium (COLACE PO) Take 100 mg by mouth 2 (two) times daily as needed.    Yes [provider]  donepezil (ARICEPT) 10 MG tablet Take 10 mg by mouth at bedtime.  04/23/18  Yes [provider]  finasteride (PROSCAR) 5 MG tablet Take 5 mg by mouth daily.  03/09/18  Yes [provider]  gabapentin (NEURONTIN) 300 MG capsule Take 300 mg by mouth 3 (three) times daily.  05/29/18  Yes [provider]  GAMMAGARD 20 GM/200ML SOLN  05/01/18  Yes [provider]  hydrochlorothiazide (MICROZIDE) 12.5 MG capsule Take 12.5 mg by mouth daily.  04/21/18  Yes [provider]  hyoscyamine (LEVSIN, ANASPAZ) 0.125 MG tablet Take 0.125 mg by mouth every 6 (six) hours as needed.  04/07/18  Yes [provider]  insulin glargine (LANTUS) 100 UNIT/ML injection Inject 30 Units into the skin every morning.   Yes [provider]  insulin lispro (HUMALOG KWIKPEN) 100 UNIT/ML KiwkPen Inject into the skin See admin instructions. Sliding scale. 1-4 times daily.   Yes [provider]  losartan (COZAAR) 100 MG tablet Take 100 mg by mouth daily.  04/21/18  Yes [provider]  methocarbamol (ROBAXIN) 500 MG tablet  04/07/18  Yes [provider]  metoprolol succinate (TOPROL-XL) 25 MG 24 hr tablet Take 25 mg by mouth daily.  03/09/18  Yes [provider]  montelukast (SINGULAIR) 10 MG tablet Take 10 mg by mouth at bedtime.   Yes [provider]  ondansetron (ZOFRAN ODT) 4 MG disintegrating tablet Take 1 tablet (4 mg total) by mouth every 8 (eight)  hours as needed for nausea or vomiting. 06/11/18  Yes Montine Circle, PA-C  oxyCODONE (ROXICODONE) 15 MG immediate release tablet Take 15 mg by mouth every 6 (six) hours as needed.  05/10/18  Yes [provider]  OXYCONTIN 15 MG 12 hr tablet Take 15 mg by mouth every 12 (twelve) hours.  05/10/18  Yes [provider]  tamsulosin (FLOMAX) 0.4 MG CAPS capsule Take 0.4 mg by mouth daily.  04/07/18  Yes [provider]  XARELTO 15 MG TABS tablet  04/07/18  Yes [provider]    Allergies  Allergen Reactions  . Betadine [Povidone Iodine] Rash  . Keflex [Cephalexin] Rash  . Penicillins Rash    Patient Active Problem List   Diagnosis Date Noted  . 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 Chronic rhinitis 06/07/2018    Past Medical History:  Diagnosis Date  . Angio-edema     Past Surgical History:  Procedure Laterality Date  . APPENDECTOMY    . GALLBLADDER SURGERY    . HERNIA REPAIR    . PAIN PUMP IMPLANTATION    . SPLENECTOMY, TOTAL    . TONSILLECTOMY      Social History   Socioeconomic History  . Marital status: Married    Spouse name: Not on file  . Number of children: Not on file  . Years of education: Not on file  . Highest education level: Not on file  Occupational History  . Not on file  Social Needs  . Financial resource strain: Not on file  . Food insecurity:    Worry: Not on file    Inability: Not on file  . Transportation needs:    Medical: Not on file    Non-medical: Not on file  Tobacco Use  . Smoking status: Former Smoker    Last attempt to quit: 06/07/1988    Years since quitting: 30.1  . Smokeless tobacco: Never Used  Substance and Sexual Activity  . Alcohol use: Not on file  . Drug use: Not on file  . Sexual activity: Not on file  Lifestyle  . Physical activity:    Days per week: Not on file    Minutes per session: Not on file  . Stress: Not on file  Relationships  . Social connections:    Talks on phone: Not on file    Gets together: Not on file    Attends religious service: Not on file    Active member of club or organization: Not on file    Attends meetings of clubs or organizations: Not on file    Relationship status: Not on file  . Intimate partner violence:      Fear of current or ex partner: Not on file    Emotionally abused: Not on file    Physically abused: Not on file    Forced sexual activity: Not on file  Other Topics Concern  . Not on file  Social History Narrative  . Not on file    No family history on file.   Review of Systems  Constitutional: Negative.  Negative for chills and fever.  HENT: Negative.  Negative for sore throat.   Eyes: Negative.  Negative for blurred vision and double vision.  Respiratory: Negative.  Negative for cough and shortness of breath.   Cardiovascular: Negative.  Negative for chest pain and palpitations.  Gastrointestinal: Positive for abdominal pain. Negative for blood in stool, constipation, diarrhea, melena, nausea and  vomiting.  Genitourinary: Negative.  Negative for dysuria and hematuria.  Skin: Positive for itching.  Neurological: Negative.  Negative for dizziness and headaches.  Endo/Heme/Allergies: Negative.     Vitals:   08/10/18 1141  BP: (!) 161/66  Pulse: 60  Temp: 98 F (36.7 C)  SpO2: 97%    Physical Exam  Constitutional: He is oriented to person, place, and time. He appears well-developed and well-nourished.  HENT:  Head: Normocephalic and atraumatic.  Nose: Nose normal.  Mouth/Throat: Oropharynx is clear and moist.  Eyes: Pupils are equal, round, and reactive to light. Conjunctivae and EOM are normal.  Neck: Normal range of motion. Neck supple.  Cardiovascular: Normal rate and regular rhythm.  Pulmonary/Chest: Effort normal and breath sounds normal.  Abdominal: Soft. Bowel sounds are normal. He exhibits no distension. There is no tenderness.  Musculoskeletal:  Right big toe shows moderate size dark lesion on plantar surface  Neurological: He is alert and oriented to person, place, and time. No sensory deficit. He exhibits normal muscle tone.  Skin: Skin is warm and dry.  Vitals reviewed.    ASSESSMENT & PLAN: Generalized abdominal pain Clinically stable with stable  vital signs.  Benign abdominal examination.  No red signs or symptoms.  Has appointment to see GI doctor next week.  Will treat with Bentyl as needed.  Advised to go to the emergency room if symptoms worsen in the next 48 hours.  Blood work done today.   Jeffery [name] was seen today for establish care.  Diagnoses and all orders for this visit:  Generalized abdominal pain -     CBC with Differential/Platelet -     Comprehensive metabolic panel -     Amylase -     dicyclomine (BENTYL) 20 MG tablet; Take 1 tablet (20 mg total) by mouth 3 (three) times daily as needed for spasms.  Encounter to establish care  Foot lesion -     Ambulatory referral to Podiatry    Patient Instructions       If you have lab work done today you will be contacted with your lab results within the next 2 weeks.  If you have not heard from [place] then please contact us. The fastest way to get your results is to register for My Chart.   IF you received an x-ray today, you will receive an invoice from [facility]. Please contact [facility] at [phone] with questions or concerns regarding your invoice.   IF you received labwork today, you will receive an invoice from [place]. Please contact [facility] at [phone] with questions or concerns regarding your invoice.   Our billing staff will not be able to assist you with questions regarding bills from these companies.  You will be contacted with the lab results as soon as they are available. The fastest way to get your results is to activate your My Chart account. Instructions are located on the last page of this paperwork. If you have not heard from [place] regarding the results in 2 weeks, please contact this office.     Abdominal Pain, Adult Many things can cause belly (abdominal) pain. Most times, belly pain is not dangerous. Many cases of belly pain can be watched and treated at home. Sometimes belly pain is serious, though. Your doctor will try  to find the cause of your belly pain. Follow these instructions at home:  Take over-the-counter and prescription medicines only as told by your doctor. Do not take medicines that help you poop (laxatives)  unless told to by your doctor.  Drink enough fluid to keep your pee (urine) clear or pale yellow.  Watch your belly pain for any changes.  Keep all follow-up visits as told by your doctor. This is important. Contact a doctor if:  Your belly pain changes or gets worse.  You are not hungry, or you lose weight without trying.  You are having trouble pooping (constipated) or have watery poop (diarrhea) for more than 2-3 days.  You have pain when you pee or poop.  Your belly pain wakes you up at night.  Your pain gets worse with meals, after eating, or with certain foods.  You are throwing up and cannot keep anything down.  You have a fever. Get help right away if:  Your pain does not go away as soon as your doctor says it should.  You cannot stop throwing up.  Your pain is only in areas of your belly, such as the right side or the left lower part of the belly.  You have bloody or black poop, or poop that looks like tar.  You have very bad pain, cramping, or bloating in your belly.  You have signs of not having enough fluid or water in your body (dehydration), such as: ? Dark pee, very little pee, or no pee. ? Cracked lips. ? Dry mouth. ? Sunken eyes. ? Sleepiness. ? Weakness. This information is not intended to replace advice given to you by your health care provider. Make sure you discuss any questions you have with your health care provider. Document Released: 02/23/2008 Document Revised: 03/26/2016 Document Reviewed: 02/18/2016 Elsevier Interactive Patient Education  2018 Elsevier Inc.      Agustina Caroli, MD [facility]

## 2018-08-10 NOTE — Assessment & Plan Note (Signed)
Clinically stable with stable vital signs.  Benign abdominal examination.  No red signs or symptoms.  Has appointment to see GI doctor next week.  Will treat with Bentyl as needed.  Advised to go to the emergency room if symptoms worsen in the next 48 hours.  Blood work done today.

## 2018-08-11 LAB — CBC WITH DIFFERENTIAL/PLATELET
Basophils Absolute: 0.1 10*3/uL (ref 0.0–0.2)
Basos: 1 %
EOS (ABSOLUTE): 0.3 10*3/uL (ref 0.0–0.4)
Eos: 3 %
Hematocrit: 33.2 % — ABNORMAL LOW (ref 37.5–51.0)
Hemoglobin: 11.2 g/dL — ABNORMAL LOW (ref 13.0–17.7)
IMMATURE GRANULOCYTES: 1 %
Immature Grans (Abs): 0.1 10*3/uL (ref 0.0–0.1)
Lymphocytes Absolute: 1.8 10*3/uL (ref 0.7–3.1)
Lymphs: 16 %
MCH: 31 pg (ref 26.6–33.0)
MCHC: 33.7 g/dL (ref 31.5–35.7)
MCV: 92 fL (ref 79–97)
MONOS ABS: 0.9 10*3/uL (ref 0.1–0.9)
Monocytes: 8 %
NEUTROS PCT: 71 %
Neutrophils Absolute: 8.2 10*3/uL — ABNORMAL HIGH (ref 1.4–7.0)
Platelets: 368 10*3/uL (ref 150–450)
RBC: 3.61 x10E6/uL — AB (ref 4.14–5.80)
RDW: 14 % (ref 12.3–15.4)
WBC: 11.4 10*3/uL — AB (ref 3.4–10.8)

## 2018-08-11 LAB — COMPREHENSIVE METABOLIC PANEL
ALBUMIN: 3.4 g/dL — AB (ref 3.5–4.8)
ALK PHOS: 104 IU/L (ref 39–117)
ALT: 13 IU/L (ref 0–44)
AST: 17 IU/L (ref 0–40)
Albumin/Globulin Ratio: 1.1 — ABNORMAL LOW (ref 1.2–2.2)
BUN / CREAT RATIO: 15 (ref 10–24)
BUN: 27 mg/dL (ref 8–27)
CHLORIDE: 102 mmol/L (ref 96–106)
CO2: 20 mmol/L (ref 20–29)
CREATININE: 1.83 mg/dL — AB (ref 0.76–1.27)
Calcium: 9.5 mg/dL (ref 8.6–10.2)
GFR calc non Af Amer: 35 mL/min/{1.73_m2} — ABNORMAL LOW (ref >59)
GFR, EST AFRICAN AMERICAN: 40 mL/min/{1.73_m2} — AB (ref >59)
GLOBULIN, TOTAL: 3.1 g/dL (ref 1.5–4.5)
Glucose: 121 mg/dL — ABNORMAL HIGH (ref 65–99)
Potassium: 4.6 mmol/L (ref 3.5–5.2)
SODIUM: 139 mmol/L (ref 134–144)
Total Protein: 6.5 g/dL (ref 6.0–8.5)

## 2018-08-11 LAB — AMYLASE: AMYLASE: 32 U/L (ref 31–124)

## 2018-08-14 ENCOUNTER — Telehealth: Payer: Self-pay | Admitting: Emergency Medicine

## 2018-08-14 ENCOUNTER — Encounter: Payer: Self-pay | Admitting: *Deleted

## 2018-08-14 NOTE — Telephone Encounter (Unsigned)
Copied from [place] [id number]. Topic: General - Other >> Aug 14, 2018  2:11 PM Valla Leaver wrote: Reason for CRM: Patient's wife Marjorie Smolder calling to find out when the patient is supposed to follow up with Dr. Mitchel Honour? Also hasn't heard back RE labs. Explained a letter was mailed.

## 2018-08-15 ENCOUNTER — Other Ambulatory Visit (HOSPITAL_COMMUNITY): Payer: Self-pay | Admitting: *Deleted

## 2018-08-15 NOTE — Telephone Encounter (Signed)
Patient wife was informed that if he was still having issue we could set up an appointment  Voice understanding

## 2018-08-16 ENCOUNTER — Ambulatory Visit (HOSPITAL_COMMUNITY)
Admission: RE | Admit: 2018-08-16 | Discharge: 2018-08-16 | Disposition: A | Discharge status: Home / Self Care | Payer: Medicare Other | Source: Ambulatory Visit | Attending: Allergy & Immunology | Admitting: Allergy & Immunology

## 2018-08-16 DIAGNOSIS — D803 Selective deficiency of immunoglobulin G [IgG] subclasses: Secondary | ICD-10-CM | POA: Diagnosis present

## 2018-08-16 MED ORDER — DIPHENHYDRAMINE HCL 25 MG PO CAPS
25.0000 mg | ORAL_CAPSULE | ORAL | Status: DC
  Administered 2018-08-16: 25 mg via ORAL

## 2018-08-16 MED ORDER — ACETAMINOPHEN 325 MG PO TABS
650.0000 mg | ORAL_TABLET | ORAL | Status: DC
  Administered 2018-08-16: 650 mg via ORAL

## 2018-08-16 MED ORDER — DIPHENHYDRAMINE HCL 25 MG PO CAPS
ORAL_CAPSULE | ORAL | Status: AC
  Administered 2018-08-16: 25 mg via ORAL
  Filled 2018-08-16: qty 1

## 2018-08-16 MED ORDER — SODIUM CHLORIDE 0.9 % IV SOLN
Freq: Once | INTRAVENOUS | Status: AC
  Administered 2018-08-16: 11:00:00 via INTRAVENOUS

## 2018-08-16 MED ORDER — IMMUNE GLOBULIN (HUMAN) 20 GM/200ML IV SOLN
30.0000 g | INTRAVENOUS | Status: DC
  Administered 2018-08-16: 30 g via INTRAVENOUS
  Filled 2018-08-16: qty 100

## 2018-08-16 MED ORDER — ACETAMINOPHEN 325 MG PO TABS
ORAL_TABLET | ORAL | Status: AC
  Administered 2018-08-16: 650 mg via ORAL
  Filled 2018-08-16: qty 2

## 2018-08-22 ENCOUNTER — Telehealth: Payer: Self-pay | Admitting: Emergency Medicine

## 2018-08-22 NOTE — Telephone Encounter (Unsigned)
Copied from [place] [id number]. Topic: General - Other >> Aug 22, 2018 11:24 AM Alanda Slim E wrote: Reason for CRM: Pt would like Dr. Mitchel Honour to give him a call on 12.04.2019 to explain and go over labs/blood work. / please advise

## 2018-08-22 NOTE — Telephone Encounter (Signed)
pls see note. Thanks

## 2018-08-24 ENCOUNTER — Ambulatory Visit (INDEPENDENT_AMBULATORY_CARE_PROVIDER_SITE_OTHER): Payer: Medicare Other | Admitting: Gastroenterology

## 2018-08-24 ENCOUNTER — Encounter: Payer: Self-pay | Admitting: Gastroenterology

## 2018-08-24 VITALS — BP 128/70 | HR 76 | Ht 65.0 in | Wt 206.0 lb

## 2018-08-24 DIAGNOSIS — K625 Hemorrhage of anus and rectum: Secondary | ICD-10-CM

## 2018-08-24 DIAGNOSIS — R1084 Generalized abdominal pain: Secondary | ICD-10-CM

## 2018-08-24 DIAGNOSIS — K5730 Diverticulosis of large intestine without perforation or abscess without bleeding: Secondary | ICD-10-CM | POA: Diagnosis not present

## 2018-08-24 DIAGNOSIS — R194 Change in bowel habit: Secondary | ICD-10-CM

## 2018-08-24 DIAGNOSIS — R933 Abnormal findings on diagnostic imaging of other parts of digestive tract: Secondary | ICD-10-CM

## 2018-08-24 NOTE — Patient Instructions (Addendum)
If you are age 77 or older, your body mass index should be between 23-30. Your Body mass index is 34.28 kg/m. If this is out of the aforementioned range listed, please consider follow up with your Primary Care Provider.  If you are age 31 or younger, your body mass index should be between 19-25. Your Body mass index is 34.28 kg/m. If this is out of the aformentioned range listed, please consider follow up with your Primary Care Provider.   Take a daily fiber supplement (I.e.metamucil or citrucel)  Wear an abdominal binder.  It was a pleasure to see you today!  Dr. Loletha Carrow  _________________________________________________________________-  Food Guidelines for gas and bloating  Many people have difficulty digesting certain foods, causing a variety of distressing and embarrassing symptoms such as abdominal pain, bloating and gas.  These foods may need to be avoided or consumed in small amounts.  Here are some tips that might be helpful for you.  1.   Lactose intolerance is the difficulty or complete inability to digest lactose, the natural sugar in milk and anything made from milk.  This condition is harmless, common, and can begin any time during life.  Some people can digest a modest amount of lactose while others cannot tolerate any.  Also, not all dairy products contain equal amounts of lactose.  For example, hard cheeses such as parmesan have less lactose than soft cheeses such as cheddar.  Yogurt has less lactose than milk or cheese.  Many packaged foods (even many brands of bread) have milk, so read ingredient lists carefully.  It is difficult to test for lactose intolerance, so just try avoiding lactose as much as possible for a week and see what happens with your symptoms.  If you seem to be lactose intolerant, the best plan is to avoid it (but make sure you get calcium from another source).  The next best thing is to use lactase enzyme supplements, available over the counter everywhere.   Just know that many lactose intolerant people need to take several tablets with each serving of dairy to avoid symptoms.  Lastly, a lot of restaurant food is made with milk or butter.  Many are things you might not suspect, such as mashed potatoes, rice and pasta (cooked with butter) and "grilled" items.  If you are lactose intolerant, it never hurts to ask your server what has milk or butter.  2.   Fiber is an important part of your diet, but not all fiber is well-tolerated.  Insoluble fiber such as bran is often consumed by normal gut bacteria and converted into gas.  Soluble fiber such as oats, squash, carrots and green beans are typically tolerated better.  3.   Some types of carbohydrates can be poorly digested.  Examples include: fructose (apples, cherries, pears, raisins and other dried fruits), fructans (onions, zucchini, large amounts of wheat), sorbitol/mannitol/xylitol and sucralose/Splenda (common artificial sweeteners), and raffinose (lentils, broccoli, cabbage, asparagus, brussel sprouts, many types of beans).  Do a Development worker, community for [employer] and you will find helpful information. Beano, a dietary supplement, will often help with raffinose-containing foods.  As with lactase tablets, you may need several per serving.  4.   Whenever possible, avoid processed food&meats and chemical additives.  High fructose corn syrup, a common sweetener, may be difficult to digest.  Eggs and soy (comes from the soybean, and added to many foods now) are other common bloating/gassy foods.  - Dr. Herma Ard [facility]

## 2018-08-24 NOTE — Progress Notes (Signed)
[facility] Consult Note:  History: Jeffery [name] 08/24/2018  Referring physician: Wynell Balloon, NP ([facility])  Reason for consult/chief complaint: Abdominal Pain; Nausea; Diarrhea; Constipation; and Abnormal CT   Subjective  HPI:  This is a pleasant 77 year old man referred by a local "[facility]" practice that saw him at his assisted living facility.  He recently moved here from [place] and apparently has a complex medical history and had multiple subspecialty consultants.  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  He was referred to oncology according to the referral notes, but they not evaluate him until he been seen by GI. This patient was seen in the [facility] ED 06/10/2018 for acute onset nausea and vomiting that day.  Believes that occurred from some she ate, and resolved quickly. Generalized abdominal pain for last several months.  I reviewed most recent primary care note with new provider, Dr. Mitchel Honour.  Recent CT scan abdomen pelvis for this abdominal pain was recently performed.  It is a noncontrast study, limiting evaluation.  Diverticulosis was noted without diverticulitis, and there was a redundant right lower abdominal wall/loss of domain with much herniated mesentery and bowel in it.  It is nonobstructed.Marland Kitchen  He has a chronic pain syndrome treated with opioids.  Dicyclomine was recently prescribed.  Jeffery [name] is here with his wife, and chart limited and tangential historians.  None of his records from work-up in [place] are currently available, but he gave [place] the name of his GI physician there.  He believes he had upper endoscopy and colonoscopy sometime in the last couple of years.  He has years of chronic generalized abdominal pain, alternates between constipation and diarrhea, and occasional painless rectal bleeding during  constipation.  If he has diarrhea he would previously take Lomotil, but was told by 1 of his providers to stop it because it was affecting his heart.  Imodium does not seem to work as well.  His abdominal pain is generalized and difficult to characterize.  Some days he "just does not feel well".  His wife says she does not know what to feed him to decrease the abdominal cramps. He has been on chronic opiates for knee pain for many years.  He has had a pain pump on the right side which eventually was taken out, and then his left-sided abdominal wall pain pump nearly stopped working, so he went on oral analgesics.    Since his original referral, he has apparently been seen by a local orthopedist earlier this week related to this left thigh-pelvic fatty mass seen on CT scan.  I do not have any records of that, but his wife seems to indicate that she and Jeffery [name] were told he needs seen by a surgical oncologist in [place].  They are uncertain how they will manage getting to that evaluation, and have not yet received a referral or appointment.  She got the impression he would need surgery for this, and Jeffery [name] is not certain he wants that.  ROS:  Review of Systems  Constitutional: Positive for appetite change. Negative for unexpected weight change.  HENT: Negative for mouth sores and voice change.   Eyes: Negative for pain and redness.  Respiratory: Positive for shortness of breath. Negative for cough.   Cardiovascular: Positive for leg swelling. Negative for chest pain and palpitations.  Genitourinary: Negative for dysuria and hematuria.  Musculoskeletal:  Positive for arthralgias, back pain and myalgias.  Skin: Negative for pallor and rash.  Neurological: Negative for weakness and headaches.  Hematological: Negative for adenopathy.     Past Medical History: Past Medical History:  Diagnosis Date  . Angio-edema   . BPH (benign prostatic hyperplasia)   . Cognitive deficits   . COPD (chronic  obstructive pulmonary disease) ([place])   . Degeneration of lumbar intervertebral disc   . Diabetes mellitus with neuropathy ([place])   . Hypertension   . Immunodeficiency syndrome ([place])   . Kidney disease   . Lump   . Metabolic syndrome   . Obesity   . Osteoarthritis   . Paroxysmal atrial fibrillation (HCC)   . Personal history of venous thrombosis and embolism   . Pituitary neoplasm   . S/P splenectomy   . 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 Sleep apnea   . Venous insufficiency (chronic) (peripheral)      Past Surgical History: Past Surgical History:  Procedure Laterality Date  . APPENDECTOMY    . GALLBLADDER SURGERY    . HERNIA REPAIR    . LUMBAR DISC SURGERY    . PAIN PUMP IMPLANTATION    . SPLENECTOMY, TOTAL    . TEMPORAL ARTERY BIOPSY / LIGATION    . TONSILLECTOMY       Family History: History reviewed. No pertinent family history. No known family history of GI malignancies.  Social History: Social History   Socioeconomic History  . Marital status: Married    Spouse name: Not on file  . Number of children: 4  . Years of education: Not on file  . Highest education level: Not on file  Occupational History  . Occupation: [employer]red [employer]   Social Needs  . Financial resource strain: Not on file  . Food insecurity:    Worry: Not on file    Inability: Not on file  . Transportation needs:    Medical: Not on file    Non-medical: Not on file  Tobacco Use  . Smoking status: Former Smoker    Last attempt to quit: 06/07/1988    Years since quitting: 30.2  . Smokeless tobacco: Never Used  Substance and Sexual Activity  . Alcohol use: Never    Frequency: Never  . Drug use: Never  . Sexual activity: Not Currently  Lifestyle  . Physical activity:    Days per week: Not on file    Minutes per session: Not on file  . Stress: Not on file  Relationships  . Social connections:    Talks on phone: Not on file    Gets together: Not on file    Attends religious service:  Not on file    Active member of club or organization: Not on file    Attends meetings of clubs or organizations: Not on file    Relationship status: Not on file  Other Topics Concern  . Not on file  Social History Narrative  . Not on file    Allergies: Allergies  Allergen Reactions  . Betadine [Povidone Iodine] Rash  . Keflex [Cephalexin] Rash  . Penicillins Rash    Outpatient Meds: Current Outpatient Medications  Medication Sig Dispense Refill  . allopurinol (ZYLOPRIM) 300 MG tablet Take 300 mg by mouth daily.     Marland Kitchen amLODipine (NORVASC) 5 MG tablet Take 10 mg by mouth daily.     Marland Kitchen atorvastatin (LIPITOR) 10 MG tablet Take 10 mg by mouth daily.     . calcitRIOL (  ROCALTROL) 0.25 MCG capsule Take 0.25 mcg by mouth daily.     . diphenoxylate-atropine (LOMOTIL) 2.5-0.025 MG tablet Take by mouth as needed for diarrhea or loose stools.    Mariane Baumgarten Sodium (COLACE PO) Take 100 mg by mouth 2 (two) times daily as needed.     . donepezil (ARICEPT) 10 MG tablet Take 10 mg by mouth at bedtime.     . finasteride (PROSCAR) 5 MG tablet Take 5 mg by mouth daily.     . Fluticasone-Umeclidin-Vilant (TRELEGY ELLIPTA IN) Inhale into the lungs every 30 (thirty) days.    Marland Kitchen gabapentin (NEURONTIN) 300 MG capsule Take 300 mg by mouth 3 (three) times daily.     Marland Kitchen GAMMAGARD 20 GM/200ML SOLN     . hyoscyamine (LEVSIN, ANASPAZ) 0.125 MG tablet Take 0.125 mg by mouth every 6 (six) hours as needed.     . insulin glargine (LANTUS) 100 UNIT/ML injection Inject 30 Units into the skin every morning.    . insulin lispro (HUMALOG KWIKPEN) 100 UNIT/ML KiwkPen Inject into the skin See admin instructions. Sliding scale. 1-4 times daily.    Marland Kitchen losartan (COZAAR) 100 MG tablet Take 100 mg by mouth daily.     . methocarbamol (ROBAXIN) 500 MG tablet     . metoprolol succinate (TOPROL-XL) 25 MG 24 hr tablet Take 25 mg by mouth daily.     . montelukast (SINGULAIR) 10 MG tablet Take 10 mg by mouth at bedtime.    Marland Kitchen omeprazole  (PRILOSEC) 20 MG capsule Take 20 mg by mouth daily.    . ondansetron (ZOFRAN ODT) 4 MG disintegrating tablet Take 1 tablet (4 mg total) by mouth every 8 (eight) hours as needed for nausea or vomiting. 10 tablet 0  . OXYCONTIN 15 MG 12 hr tablet Take 15 mg by mouth every 12 (twelve) hours.     . tamsulosin (FLOMAX) 0.4 MG CAPS capsule Take 0.4 mg by mouth daily.     Alveda Reasons 15 MG TABS tablet      No current facility-administered medications for this visit.       ___________________________________________________________________ Objective   Exam:  BP 128/70   Pulse 76   Ht 5\' 5"  (1.651 m)   Wt 206 lb (93.4 kg)   BMI 34.28 kg/m    General: this is a(n) feeble elderly man, no acute distress.  Slow, antalgic gait.  Gets on exam table with difficulty, it is hard for him to lay flat and turn to the left complete exam.  Eyes: sclera anicteric, no redness  ENT: oral mucosa moist without lesions, no cervical or supraclavicular lymphadenopathy  CV: RRR without murmur, S1/S2,+ peripheral edema  Resp: clear to auscultation bilaterally, normal RR and effort noted  GI: soft, no tenderness, with active bowel sounds.  Cannot assess hepatosplenomegaly due to body habitus.  His right hemiabdomen has loss of domain large pannus.  He has a long midline scar.  Skin; warm and dry, no rash or jaundice noted  Neuro: awake, alert and oriented x 3. Normal gross motor function and fluent speech Rectal: Normal external, decreased sphincter tone, no palpable internal lesions or tenderness or fissure.  Soft stool in rectal vault.  Labs:  CBC Latest Ref Rng & Units 08/10/2018 06/11/2018 06/10/2018  WBC 3.4 - 10.8 x10E3/uL 11.4(H) - 11.8(H)  Hemoglobin 13.0 - 17.7 g/dL 11.2(L) 11.2(L) 10.9(L)  Hematocrit 37.5 - 51.0 % 33.2(L) 33.0(L) 33.3(L)  Platelets 150 - 450 x10E3/uL 368 - 287   CMP Latest Ref  Rng & Units 08/10/2018 06/11/2018 06/10/2018  Glucose 65 - 99 mg/dL 121(H) 192(H) 192(H)  BUN 8 - 27  mg/dL 27 48(H) 39(H)  Creatinine 0.76 - 1.27 mg/dL 1.83(H) 1.90(H) 2.01(H)  Sodium 134 - 144 mmol/L 139 135 139  Potassium 3.5 - 5.2 mmol/L 4.6 5.2(H) 4.4  Chloride 96 - 106 mmol/L 102 102 103  CO2 20 - 29 mmol/L 20 - 25  Calcium 8.6 - 10.2 mg/dL 9.5 - 9.0  Total Protein 6.0 - 8.5 g/dL 6.5 - 6.3(L)  Total Bilirubin 0.0 - 1.2 mg/dL <0.2 - 0.4  Alkaline Phos 39 - 117 IU/L 104 - 82  AST 0 - 40 IU/L 17 - 18  ALT 0 - 44 IU/L 13 - 15     Radiologic Studies: 06/11/2018 CT abdomen pelvis IV contrast:   EXAM: CT ABDOMEN AND PELVIS WITH CONTRAST   TECHNIQUE: Multidetector CT imaging of the abdomen and pelvis was performed using the standard protocol following bolus administration of intravenous contrast.   CONTRAST:  24mL ISOVUE-300 IOPAMIDOL (ISOVUE-300) INJECTION 61%   COMPARISON:  None.   FINDINGS: Lower chest: Bilateral posterior pleural thickening or subpleural scarring. There is mild bronchiectatic changes of the visualized lung bases as well as diffuse interstitial prominence and thickening. Coronary vascular calcifications noted.   No intra-abdominal free air or free fluid.   Hepatobiliary: Cholecystectomy. The liver is unremarkable. There is mild intrahepatic biliary ductal dilatation, likely post cholecystectomy. No retained calcified stone noted in the central CBD.   Pancreas: Unremarkable. No pancreatic ductal dilatation or surrounding inflammatory changes.   Spleen: Splenectomy.   Adrenals/Urinary Tract: The adrenal glands are unremarkable. Multiple small left renal hypodense lesions measure up to 2 cm in the interpolar aspect of the left kidney. These lesions are suboptimally characterized but the larger lesions demonstrate fluid attenuation most consistent with cysts and the smaller lesions are too small to characterize. There is no hydronephrosis on either side. The visualized ureters and urinary bladder appear unremarkable.   Stomach/Bowel: There is  sigmoid diverticulosis without active inflammatory changes. Moderate stool noted throughout the colon. There is no bowel obstruction or active inflammation. Appendectomy. Thickened appearance of the rectum likely related to underdistention. No associated inflammatory changes.   Vascular/Lymphatic: Advanced aortoiliac atherosclerotic disease. The IVC is unremarkable. No portal venous gas. Top-normal retroperitoneal lymph nodes no adenopathy. Small nodular density along the surface of the left hemidiaphragm measure up to 10 mm (coronal series 5, image 60) are indeterminate but may represent small lymph nodes or splenic tissue. If there is history of known malignancy, metastatic implants are not excluded.   Reproductive: The prostate and seminal vesicles are grossly unremarkable.   Other: There is a 6 x 10 cm partially visualized fat containing lesion in the left pelvic musculature. This is incompletely characterized. Further characterization with MRI without and with contrast on a nonemergent basis recommended. A generator device is noted in the subcutaneous soft tissues of the anterior abdominal wall. There is a large pannus containing loops of small bowel without evidence of obstruction.   Musculoskeletal: Osteopenia with multilevel degenerative changes of the spine most prominent at T12-L2. No acute osseous pathology.   IMPRESSION: 1. No acute intra-abdominal or pelvic pathology. 2. Colonic diverticulosis. No bowel obstruction or active inflammation. 3. Small nodular densities along the inferior surface of the left hemidiaphragm may represent lymph nodes, or residual splenic tissue. Metastatic implants are not excluded. Correlation with history of known malignancy recommended. 4. Large fat containing lesion in the medial musculature  of the left proximal thigh. Further characterization with MRI is recommended.     Electronically Signed   By: Anner Crete M.D.   On:  06/11/2018 02:11   _______________________________________________________________________ 07/26/18 CT pelvis- non contrast  (images personally reviewed)  COMPARISON:  06/11/2018   FINDINGS: Urinary Tract: Tiny cyst at inferior pole LEFT kidney unchanged. Cortical atrophy of both kidneys. No hydronephrosis or ureteral dilatation. Decompressed bladder.   Bowel: Extension of nonobstructed small bowel loops into redundant RIGHT abdominal pannus. Sigmoid diverticulosis without evidence of diverticulitis. Bowel loops otherwise unremarkable.   Vascular/Lymphatic: Atherosclerotic calcifications aorta and iliac arteries. Visualized aorta normal caliber.   Reproductive:  Unremarkable prostate gland   Other: No free air or free fluid. No hernia or acute inflammatory process visualized. Few scattered normal sized pelvic lymph nodes.   Musculoskeletal: Bones demineralized. Mild degenerative disc and facet disease changes at visualized lower lumbar spine. Minimal narrowing of the hip joints bilaterally. Large fat containing mass identified at the medial upper RIGHT thigh, extending distally beyond extent of this exam, visualized lesion measuring 12.1 x 5.1 cm in greatest axial dimensions extending greater than 7 cm length identified at the medial LEFT upper thigh suspect arising within the LEFT adductor brevis muscle.   IMPRESSION: No acute intrapelvic abnormalities.   Distal colonic diverticulosis without evidence of diverticulitis.   Scattered atherosclerotic calcifications.   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     Electronically Signed   By: Lavonia Dana M.D.   On: 07/26/2018 16:54   ___________________________________________   Lower extremity ultrasound included with his referral dated 06/12/2018 shows no  DVT.  Assessment: Encounter Diagnoses  Name Primary?  . Generalized abdominal pain Yes  . Altered bowel habits   . Diverticulosis of colon without hemorrhage   . Rectal bleeding   . Abnormal finding on GI tract imaging     I found it very difficult to synthesize Frank's long-standing chronic digestive symptoms in the setting of his multiple complex medical issues with work-up out of state and current work-up being evaluated and treated in different practices.  He seems to have digestive issues related to multiple prior surgeries, multiple complex medical issues, chronic opioid therapy.  It sounds like there is been an endoscopic work-up, and I will try to get the results of that to be complete.  It is not clear why there was concern for metastatic malignancy, other than perhaps this verbiage be included in the radiologist's report from the September CT scan.  It seems like a nonspecific finding, and clearly his most pressing issue is that this mass that sounds as if it requires surgical attention and might be malignant. I have no further digestive testing planned for him at present.  He has high risk for endoscopic procedures.  I do not want to add more medicines to him if it can be avoided given his complexity of medical condition and polypharmacy. His rectal bleeding seems likely benign anal rectal bleeding, possibly hemorrhoidal and related to constipation. It is difficult to give him and his wife clear dietary recommendations.  I will be glad to see him as the need arises.  Total time 60 minutes, over half spent in extensive record review and counseling and coordinating care face-to-face with patient and wife.  Thank you for the courtesy of this consult.  Please call me with any questions  or concerns.  Nelida Meuse III  CC: Horald Pollen, MD  And referring provider noted above

## 2018-08-28 ENCOUNTER — Ambulatory Visit (INDEPENDENT_AMBULATORY_CARE_PROVIDER_SITE_OTHER): Payer: Medicare Other | Admitting: Allergy and Immunology

## 2018-08-28 ENCOUNTER — Encounter: Payer: Self-pay | Admitting: Allergy and Immunology

## 2018-08-28 VITALS — BP 128/70 | HR 60 | Resp 16 | Ht 67.0 in | Wt 209.6 lb

## 2018-08-28 DIAGNOSIS — D803 Selective deficiency of immunoglobulin G [IgG] subclasses: Secondary | ICD-10-CM

## 2018-08-28 DIAGNOSIS — J310 Chronic rhinitis: Secondary | ICD-10-CM | POA: Diagnosis not present

## 2018-08-28 DIAGNOSIS — J449 Chronic obstructive pulmonary disease, unspecified: Secondary | ICD-10-CM

## 2018-08-28 NOTE — Progress Notes (Signed)
Follow-up Note  RE: Jeffery [name] MRN: [id number] DOB: [birth year] Date of Office Visit: 08/28/2018  Primary care provider: Horald Pollen, MD Referring provider: Raymondo Band, MD  History of present illness: Jeffery [name] is a 77 y.o. male with immunoglobulin deficiency, COPD, and chronic rhinitis presenting today for follow-up.  He was previously seen in this clinic for his initial evaluation by Dr. Ernst Bowler on June 07, 2018.  He is accompanied today by his wife who assists with the history.  His COPD has been relatively stable in the interval since his previous visit.  He experiences dyspnea with moderate exertion however he admits that he has not been using Trelegy on a daily basis as prescribed.  The process of pulmonology referral was initiated by Dr. Ernst Bowler via [place] but the consult has not taken place yet.   He reports that he received his first IVIG infusion at [facility] and is scheduled for his next infusion on December 26.   He has no nasal symptom complaints today.  Assessment and plan: Chronic obstructive pulmonary disease  The importance of consistency with asthma/COPD controller medications has been discussed and emphasized.  Trelegy, 1 inhalation daily.  Dr. Ernst Bowler has initiated the referral process for pulmonology consultation.   IgG1 subclass deficiency (HCC)  Continue immunoglobulin G replacement as prescribed and as tolerated.  Chronic rhinitis  Continue Nasacort, 1 to 2 sprays per nostril daily as needed.  Nasal saline spray (i.e., Simply Saline) or nasal saline lavage (i.e., NeilMed) is recommended as needed and prior to medicated nasal sprays.   Diagnostics: Spirometry reveals an FVC of 1.96 L (54% predicted) and an FEV1 of 1.10 L (42% predicted) without significant postbronchodilator improvement.  Restrictive/obstructive pattern without significant reversibility.  Please see scanned spirometry results  for details.    Physical examination: Blood pressure 128/70, pulse 60, resp. rate 16, height 5\' 7"  (1.702 m), weight 209 lb 9.6 oz (95.1 kg), SpO2 97 %.  General: Alert, interactive, in no acute distress. HEENT: TMs pearly gray, turbinates mildly edematous without discharge, post-pharynx moderately erythematous. Neck: Supple without lymphadenopathy. Lungs: Clear to auscultation without wheezing, rhonchi or rales. CV: Normal S1, S2 without murmurs. Skin: Warm and dry, without lesions or rashes.  The following portions of the patient's history were reviewed and updated as appropriate: allergies, current medications, past family history, past medical history, past social history, past surgical history and problem list.  Allergies as of 08/28/2018      Reactions   Betadine [povidone Iodine] Rash   Keflex [cephalexin] Rash   Penicillins Rash      Medication List        Accurate as of 08/28/18 12:43 PM. Always use your most recent med list.          allopurinol 300 MG tablet Commonly known as:  ZYLOPRIM Take 300 mg by mouth daily.   amLODipine 5 MG tablet Commonly known as:  NORVASC Take 10 mg by mouth daily.   atorvastatin 10 MG tablet Commonly known as:  LIPITOR Take 10 mg by mouth daily.   calcitRIOL 0.25 MCG capsule Commonly known as:  ROCALTROL Take 0.25 mcg by mouth daily.   COLACE PO Take 100 mg by mouth 2 (two) times daily as needed.   diphenoxylate-atropine 2.5-0.025 MG tablet Commonly known as:  LOMOTIL Take by mouth as needed for diarrhea or loose stools.   donepezil 10 MG tablet Commonly known as:  ARICEPT Take 10 mg by mouth at bedtime.  finasteride 5 MG tablet Commonly known as:  PROSCAR Take 5 mg by mouth daily.   gabapentin 300 MG capsule Commonly known as:  NEURONTIN Take 300 mg by mouth 3 (three) times daily as needed.   GAMMAGARD 20 GM/200ML Soln Generic drug:  Immune Globulin (Human)   HUMALOG KWIKPEN 100 UNIT/ML KiwkPen Generic drug:   insulin lispro Inject into the skin See admin instructions. Sliding scale. 1-4 times daily.   hyoscyamine 0.125 MG tablet Commonly known as:  LEVSIN, ANASPAZ Take 0.125 mg by mouth every 6 (six) hours as needed.   LANTUS 100 UNIT/ML injection Generic drug:  insulin glargine Inject 30 Units into the skin every morning.   losartan 100 MG tablet Commonly known as:  COZAAR Take 100 mg by mouth daily.   methocarbamol 500 MG tablet Commonly known as:  ROBAXIN   metoprolol succinate 25 MG 24 hr tablet Commonly known as:  TOPROL-XL Take 25 mg by mouth daily.   montelukast 10 MG tablet Commonly known as:  SINGULAIR Take 10 mg by mouth at bedtime.   omeprazole 20 MG capsule Commonly known as:  PRILOSEC Take 20 mg by mouth daily.   ondansetron 4 MG disintegrating tablet Commonly known as:  ZOFRAN-ODT Take 1 tablet (4 mg total) by mouth every 8 (eight) hours as needed for nausea or vomiting.   OXYCONTIN 15 mg 12 hr tablet Generic drug:  oxyCODONE Take 15 mg by mouth every 12 (twelve) hours.   tamsulosin 0.4 MG Caps capsule Commonly known as:  FLOMAX Take 0.4 mg by mouth daily.   TRELEGY ELLIPTA IN Inhale into the lungs every 30 (thirty) days.   XARELTO 15 MG Tabs tablet Generic drug:  Rivaroxaban       Allergies  Allergen Reactions  . Betadine [Povidone Iodine] Rash  . Keflex [Cephalexin] Rash  . Penicillins Rash    I appreciate the opportunity to take part in Jeffery [name]'s care. Please do not hesitate to contact me with questions.  Sincerely,   R. Edgar Frisk, MD

## 2018-08-28 NOTE — Assessment & Plan Note (Signed)
   Continue Nasacort, 1 to 2 sprays per nostril daily as needed.  Nasal saline spray (i.e., Simply Saline) or nasal saline lavage (i.e., NeilMed) is recommended as needed and prior to medicated nasal sprays.

## 2018-08-28 NOTE — Assessment & Plan Note (Signed)
   Continue immunoglobulin G replacement as prescribed and as tolerated.

## 2018-08-28 NOTE — Assessment & Plan Note (Addendum)
   The importance of consistency with asthma/COPD controller medications has been discussed and emphasized.  Trelegy, 1 inhalation daily.  Dr. Ernst Bowler has initiated the referral process for pulmonology consultation.

## 2018-08-28 NOTE — Patient Instructions (Addendum)
Chronic obstructive pulmonary disease  The importance of consistency with asthma/COPD controller medications has been discussed and emphasized.  Trelegy, 1 inhalation daily.  Dr. Ernst Bowler has initiated the referral process for pulmonology consultation.   IgG1 subclass deficiency (HCC)  Continue immunoglobulin G replacement as prescribed and as tolerated.  Chronic rhinitis  Continue Nasacort, 1 to 2 sprays per nostril daily as needed.  Nasal saline spray (i.e., Simply Saline) or nasal saline lavage (i.e., NeilMed) is recommended as needed and prior to medicated nasal sprays.   Return for follow up with Dr. Ernst Bowler in about 4 months, or if symptoms worsen or fail to improve.

## 2018-08-29 ENCOUNTER — Ambulatory Visit: Payer: Medicare Other | Admitting: Allergy & Immunology

## 2018-08-29 ENCOUNTER — Ambulatory Visit (INDEPENDENT_AMBULATORY_CARE_PROVIDER_SITE_OTHER): Payer: Medicare Other | Admitting: Podiatry

## 2018-08-29 ENCOUNTER — Encounter: Payer: Self-pay | Admitting: Podiatry

## 2018-08-29 VITALS — BP 149/69 | HR 52 | Resp 16

## 2018-08-29 DIAGNOSIS — L97511 Non-pressure chronic ulcer of other part of right foot limited to breakdown of skin: Secondary | ICD-10-CM

## 2018-08-29 DIAGNOSIS — B351 Tinea unguium: Secondary | ICD-10-CM | POA: Diagnosis not present

## 2018-08-29 DIAGNOSIS — M79676 Pain in unspecified toe(s): Secondary | ICD-10-CM | POA: Diagnosis not present

## 2018-08-29 DIAGNOSIS — E1142 Type 2 diabetes mellitus with diabetic polyneuropathy: Secondary | ICD-10-CM | POA: Diagnosis not present

## 2018-08-29 MED ORDER — MUPIROCIN 2 % EX OINT: TOPICAL_OINTMENT | CUTANEOUS | 1 refills | Status: DC

## 2018-08-29 NOTE — Progress Notes (Signed)
Subjective:  Patient ID: Jeffery [name], male    DOB: 03/21/1941,  MRN: [id number] HPI Chief Complaint  Patient presents with  . Painful lesion    Right foot; great toe-bottom of toe; pt stated, "My toe does not hurt" x2-3 months; pt Diabetic Type 2; Sugar=134 this am; A1C=does not know -not in chart  . Debridement    Bilateral nail trim  . Diabetic Shoes    Wants to discuss getting some diabetic shoes    77 y.o. male presents with the above complaint.   ROS: Denies fever chills nausea vomiting muscle aches pains calf pain back pain chest pain shortness of breath.  Past Medical History:  Diagnosis Date  . Angio-edema   . BPH (benign prostatic hyperplasia)   . Cognitive deficits   . COPD (chronic obstructive pulmonary disease) ([place])   . Degeneration of lumbar intervertebral disc   . Diabetes mellitus with neuropathy ([place])   . Hypertension   . Immunodeficiency syndrome ([place])   . Kidney disease   . Lump   . Metabolic syndrome   . Obesity   . Osteoarthritis   . Paroxysmal atrial fibrillation (HCC)   . Personal history of venous thrombosis and embolism   . Pituitary neoplasm   . S/P splenectomy   . 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 Sleep apnea   . Venous insufficiency (chronic) (peripheral)    Past Surgical History:  Procedure Laterality Date  . APPENDECTOMY    . GALLBLADDER SURGERY    . HERNIA REPAIR    . LUMBAR DISC SURGERY    . PAIN PUMP IMPLANTATION    . SPLENECTOMY, TOTAL    . TEMPORAL ARTERY BIOPSY / LIGATION    . TONSILLECTOMY      Current Outpatient Medications:  .  allopurinol (ZYLOPRIM) 300 MG tablet, Take 300 mg by mouth daily. , Disp: , Rfl:  .  amLODipine (NORVASC) 5 MG tablet, Take 10 mg by mouth daily. , Disp: , Rfl:  .  atorvastatin (LIPITOR) 10 MG tablet, Take 10 mg by mouth daily. , Disp: , Rfl:  .  calcitRIOL (ROCALTROL) 0.25 MCG capsule, Take 0.25 mcg by mouth daily. , Disp: , Rfl:  .  diphenoxylate-atropine (LOMOTIL) 2.5-0.025 MG tablet,  Take by mouth as needed for diarrhea or loose stools., Disp: , Rfl:  .  Docusate Sodium (COLACE PO), Take 100 mg by mouth 2 (two) times daily as needed. , Disp: , Rfl:  .  donepezil (ARICEPT) 10 MG tablet, Take 10 mg by mouth at bedtime. , Disp: , Rfl:  .  finasteride (PROSCAR) 5 MG tablet, Take 5 mg by mouth daily. , Disp: , Rfl:  .  Fluticasone-Umeclidin-Vilant (TRELEGY ELLIPTA IN), Inhale into the lungs every 30 (thirty) days., Disp: , Rfl:  .  gabapentin (NEURONTIN) 300 MG capsule, Take 300 mg by mouth 3 (three) times daily as needed. , Disp: , Rfl:  .  GAMMAGARD 20 GM/200ML SOLN, , Disp: , Rfl:  .  hyoscyamine (LEVSIN, ANASPAZ) 0.125 MG tablet, Take 0.125 mg by mouth every 6 (six) hours as needed. , Disp: , Rfl:  .  insulin glargine (LANTUS) 100 UNIT/ML injection, Inject 30 Units into the skin every morning., Disp: , Rfl:  .  insulin lispro (HUMALOG KWIKPEN) 100 UNIT/ML KiwkPen, Inject into the skin See admin instructions. Sliding scale. 1-4 times daily., Disp: , Rfl:  .  losartan (COZAAR) 100 MG tablet, Take 100 mg by mouth daily. , Disp: , Rfl:  .  methocarbamol (  ROBAXIN) 500 MG tablet, , Disp: , Rfl:  .  metoprolol succinate (TOPROL-XL) 25 MG 24 hr tablet, Take 25 mg by mouth daily. , Disp: , Rfl:  .  montelukast (SINGULAIR) 10 MG tablet, Take 10 mg by mouth at bedtime., Disp: , Rfl:  .  omeprazole (PRILOSEC) 20 MG capsule, Take 20 mg by mouth daily., Disp: , Rfl:  .  ondansetron (ZOFRAN ODT) 4 MG disintegrating tablet, Take 1 tablet (4 mg total) by mouth every 8 (eight) hours as needed for nausea or vomiting., Disp: 10 tablet, Rfl: 0 .  OXYCONTIN 15 MG 12 hr tablet, Take 15 mg by mouth every 12 (twelve) hours. , Disp: , Rfl:  .  tamsulosin (FLOMAX) 0.4 MG CAPS capsule, Take 0.4 mg by mouth daily. , Disp: , Rfl:  .  XARELTO 15 MG TABS tablet, , Disp: , Rfl:  .  mupirocin ointment (BACTROBAN) 2 %, Apply to wound after soaking BID, Disp: 30 g, Rfl: 1  Allergies  Allergen Reactions  .  Betadine [Povidone Iodine] Rash  . Keflex [Cephalexin] Rash  . Penicillins Rash   Review of Systems Objective:   Vitals:   08/29/18 1321  BP: (!) 149/69  Pulse: (!) 52  Resp: 16    General: Well developed, nourished, in no acute distress, alert and oriented x3   Dermatological: Skin is warm, dry and supple bilateral. Nails x 10 are well maintained; remaining integument appears unremarkable at this time. There are no open sores, no preulcerative lesions, no rash or signs of infection present.  Diabetic ulceration plantar medial aspect of the great toe at the level of the hallux interphalangeal joint.  This does not probe to bone.  Vascular: Dorsalis Pedis artery and Posterior Tibial artery pedal pulses are 2/4 bilateral with immedate capillary fill time. Pedal hair growth present. No varicosities and no lower extremity edema present bilateral.   Neruologic: Grossly intact via light touch bilateral. Vibratory intact via tuning fork bilateral. Protective threshold with Semmes Wienstein monofilament intact to all pedal sites bilateral. Patellar and Achilles deep tendon reflexes 2+ bilateral. No Babinski or clonus noted bilateral.   Musculoskeletal: No gross boney pedal deformities bilateral. No pain, crepitus, or limitation noted with foot and ankle range of motion bilateral. Muscular strength 5/5 in all groups tested bilateral.  Gait: Unassisted, Nonantalgic.    Radiographs:  None taken  Assessment & Plan:   Assessment: Diabetic ulceration plantar medial aspect hallux interphalangeal joint right foot.  Plan: Debridement reactive hyperkeratotic lesion today placed on mupirocin cream instructed to dress the foot daily.  Also debrided toenails 1 through 5.  We will follow-up with him in a couple weeks just to make sure he is doing well.     Max T. [place], [place]

## 2018-09-01 ENCOUNTER — Ambulatory Visit: Payer: Medicare Other | Admitting: Internal Medicine

## 2018-09-04 ENCOUNTER — Ambulatory Visit (INDEPENDENT_AMBULATORY_CARE_PROVIDER_SITE_OTHER): Payer: Medicare Other | Admitting: Cardiology

## 2018-09-04 ENCOUNTER — Telehealth: Payer: Self-pay | Admitting: Cardiology

## 2018-09-04 ENCOUNTER — Encounter: Payer: Self-pay | Admitting: Cardiology

## 2018-09-04 VITALS — BP 144/68 | HR 59 | Ht 67.0 in | Wt 209.4 lb

## 2018-09-04 DIAGNOSIS — I480 Paroxysmal atrial fibrillation: Secondary | ICD-10-CM | POA: Diagnosis not present

## 2018-09-04 DIAGNOSIS — R600 Localized edema: Secondary | ICD-10-CM

## 2018-09-04 DIAGNOSIS — G4733 Obstructive sleep apnea (adult) (pediatric): Secondary | ICD-10-CM

## 2018-09-04 DIAGNOSIS — I5032 Chronic diastolic (congestive) heart failure: Secondary | ICD-10-CM

## 2018-09-04 DIAGNOSIS — I10 Essential (primary) hypertension: Secondary | ICD-10-CM

## 2018-09-04 MED ORDER — FUROSEMIDE 20 MG PO TABS: ORAL_TABLET | ORAL | 3 refills | Status: DC

## 2018-09-04 NOTE — Telephone Encounter (Signed)
Medical records requested from [facility] and [facility] Louretta Shorten, MD) 09/04/18 vlm

## 2018-09-04 NOTE — Progress Notes (Signed)
PCP: Horald Pollen, MD  Former Cardiologist:   Louretta Shorten, MD  Cardiologist in [place], [place]  Address: [address] [place] [address]  Phone: [phone]  [facility] in [place], [place]  Address: [address], [place] [address]   Clinic Note: Chief Complaint  Patient presents with  . New Patient (Initial Visit)    Needs local Cardiologist  . Leg Swelling    chronic CHF  . Atrial Fibrillation    HPI: Jeffery [name] is a 77 y.o. male who is being seen today for 'Homer" with "LEG EDEMA"  at the request of Coady, Gordy Clement, NP. To establish Cardiologist (had been followed by a Cardiologist in [place].  Yvonna Alanis was referred by S[place]y to establish cardiology care.  Was seen on September 29th. -->  He was noted to be extremely edematous with minimal help from HCTZ 12.5 mg.  Was started on Lasix 20 mg twice daily.  Was also encouraged to wear TED hose and foot elevation with his history of venous insufficiency and DVTs.  Noted concerns with wife managing his medications as opposed to outside nursing -adamantly against assisted living care or outside assistance. --Noticed lobe serum protein levels.  Was also noted to be hypertensive.  Called and 25 mg twice daily metoprolol.  Recent Hospitalizations: None  Studies Personally Reviewed - (if available, images/films reviewed: From Epic Chart or Care Everywhere)  Not available here  Interval History: Jeffery [name] presents here today with his wife to establish cardiology care.  A good portion of this visit was spent trying to learn his past medical history which they were not very familiar with.  He has multiple noncardiac medical issues including COPD, morbid obesity with OSA not currently using CPAP.  (Does not have a sleep medicine physician). They report that he has a history of A. fib but has not noticed any symptoms for over 2  years.  He also has been diagnosis of chronic atrial fibrillation but had not been on Lasix.  The medications he was on when seen by referring provider was amlodipine 5 mg, losartan 100 mg and HCTZ 12.5 mg.  He has two-pillow orthopnea at baseline, more related to his OSA, but has not had any PND symptoms.  He has chronic lower extremity edema is much related to his venous stasis and prior DVT.  He has noted that his swelling is improved significantly since starting on Lasix and is starting to wear the support stockings although he is not wearing them currently today.  His last hospitalization for CHF was more than a year ago.  Apparently he had a recent stress test and/or echocardiogram done in [place] by his former cardiologist. His last hospitalization was more related to back pain, and he is now very limited as far as his mobility.  He walks with a walker for short distances mostly because of his back and OA pain.  He actually had 2 months of rehab and is starting to get some more mobility.  He has not had any sensation of chest tightness or pressure with rest or exertion.  He denies any rapid irregular heartbeat sensations unless he is trying to walk.  No syncope or near syncope, although he does get lightheaded sometimes standing up.  No TIA or amaurosis fugax symptoms.  No unilateral swelling.  He is on Xarelto and denies any significant melena, hematochezia, hematuria, or epstaxis; however he does  note easy bruising..  It is hard to tell if he has claudication or if it simply leg aching from venous insufficiency.  Does not walk enough to truly diagnose.  ROS: A comprehensive was performed. Review of Systems  Constitutional: Positive for malaise/fatigue (Generalized lack of energy). Negative for chills, fever and weight loss.  HENT: Negative for congestion and nosebleeds.   Respiratory: Positive for cough (Has a mild nagging cough), shortness of breath (At baseline) and wheezing (Off-and-on  depending on the weather). Negative for sputum production.   Gastrointestinal: Positive for constipation (Has constant problems with this.  Primary physician recently added senna). Negative for abdominal pain and heartburn.  Genitourinary: Positive for frequency.  Musculoskeletal: Positive for back pain and joint pain. Negative for falls.  Neurological: Positive for weakness (His legs are generally weak).  Psychiatric/Behavioral: Positive for memory loss.  All other systems reviewed and are negative.   PAD Screen 09/04/2018  Previous PAD dx? Yes  Previous surgical procedure? Yes  Pain with walking? Yes  Subsides with rest? Yes  Feet/toe relief with dangling? Yes  Painful, non-healing ulcers? No  Extremities discolored? No    I have reviewed and (if needed) personally updated the patient's problem list, medications, allergies, past medical and surgical history, social and family history.   Past Medical History:  Diagnosis Date  . Angio-edema   . BPH (benign prostatic hyperplasia)   . Cognitive deficits   . COPD (chronic obstructive pulmonary disease) ([place])   . Degeneration of lumbar intervertebral disc   . Diabetes mellitus with neuropathy ([place])   . Hypertension   . Immunodeficiency syndrome ([place])   . Kidney disease   . Lump   . Metabolic syndrome   . Obesity   . Osteoarthritis   . Paroxysmal atrial fibrillation (HCC)   . Personal history of venous thrombosis and embolism   . Pituitary neoplasm   . S/P splenectomy   . 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 Sleep apnea   . Venous insufficiency (chronic) (peripheral)     Past Surgical History:  Procedure Laterality Date  . APPENDECTOMY    . GALLBLADDER SURGERY    . HERNIA REPAIR    . LUMBAR DISC SURGERY    . PAIN PUMP IMPLANTATION    . SPLENECTOMY, TOTAL    . TEMPORAL ARTERY BIOPSY / LIGATION    . TONSILLECTOMY      Current Meds  Medication Sig  . allopurinol (ZYLOPRIM) 300 MG tablet Take 300 mg by mouth daily.    Marland Kitchen amLODipine (NORVASC) 10 MG tablet Take 10 mg by mouth daily.   . calcitRIOL (ROCALTROL) 0.25 MCG capsule Take 0.25 mcg by mouth daily.   Marland Kitchen donepezil (ARICEPT) 10 MG tablet Take 10 mg by mouth at bedtime.   . finasteride (PROSCAR) 5 MG tablet Take 5 mg by mouth daily.   . Fluticasone-Umeclidin-Vilant (TRELEGY ELLIPTA IN) Inhale 2 puffs into the lungs daily.   Marland Kitchen gabapentin (NEURONTIN) 300 MG capsule Take 300 mg by mouth 3 (three) times daily as needed.   Marland Kitchen GAMMAGARD 20 GM/200ML SOLN every 30 (thirty) days.   . insulin glargine (LANTUS) 100 UNIT/ML injection Inject 30 Units into the skin every morning.  . insulin lispro (HUMALOG KWIKPEN) 100 UNIT/ML KiwkPen Inject into the skin See admin instructions. Sliding scale. 1-4 times daily.  Marland Kitchen losartan (COZAAR) 100 MG tablet Take 100 mg by mouth daily.   . metoprolol succinate (TOPROL-XL) 25 MG 24 hr tablet Take 25 mg by mouth daily.   Marland Kitchen  montelukast (SINGULAIR) 10 MG tablet Take 10 mg by mouth at bedtime.  Marland Kitchen omeprazole (PRILOSEC) 10 MG capsule Take by mouth daily.   Marland Kitchen oxyCODONE (ROXICODONE) 15 MG immediate release tablet Take 15 mg by mouth every 6 (six) hours as needed for pain.  . OXYCONTIN 15 MG 12 hr tablet Take 15 mg by mouth every 12 (twelve) hours.   . tamsulosin (FLOMAX) 0.4 MG CAPS capsule Take 0.4 mg by mouth daily.   Alveda Reasons 15 MG TABS tablet Take 15 mg by mouth daily with supper.     Allergies  Allergen Reactions  . Betadine [Povidone Iodine] Rash  . Keflex [Cephalexin] Rash  . Penicillins Rash    Social History   Tobacco Use  . Smoking status: Former Smoker    Last attempt to quit: 06/07/1988    Years since quitting: 30.3  . Smokeless tobacco: Never Used  Substance Use Topics  . Alcohol use: Never    Frequency: Never  . Drug use: Never   Social History   Social History Narrative   He and his wife moved to [place] roughly 2 years ago to be close to their children.  They have 4 adult children that live in [place], so  they moved to be closer to the family.   Jeffery [name] is not very active mostly related to arthritis and low back pain.   They currently reside in an independent living facility.    Family history is unknown by patient.  Wt Readings from Last 3 Encounters:  09/04/18 209 lb 6.4 oz (95 kg)  08/28/18 209 lb 9.6 oz (95.1 kg)  08/24/18 206 lb (93.4 kg)    PHYSICAL EXAM BP (!) 144/68 (BP Location: Left Arm, Patient Position: Sitting, Cuff Size: Large)   Pulse (!) 59   Ht 5\' 7"  (1.702 m)   Wt 209 lb 6.4 oz (95 kg)   BMI 32.80 kg/m  Physical Exam  Constitutional: He is oriented to person, place, and time. No distress.  Obese (mostly truncal) with chronic ill appearance.  Sitting in wheelchair.Mildly disheveled.  HENT:  Head: Normocephalic and atraumatic.  Mouth/Throat: Oropharynx is clear and moist.  Eyes: Pupils are equal, round, and reactive to light. Conjunctivae and EOM are normal. No scleral icterus.  Neck: Normal range of motion. Neck supple. No hepatojugular reflux (Unable to assess due to body habitus) and no JVD present. Carotid bruit is not present. No tracheal deviation present.  Cardiovascular: Regular rhythm.  Occasional extrasystoles are present. Bradycardia present. PMI is not displaced (Unable to palpate). Exam reveals distant heart sounds and decreased pulses (Difficult to palpate pedal pulses due to edema and body habitus). Exam reveals no gallop and no friction rub.  No murmur heard. Pulmonary/Chest: Effort normal and breath sounds normal. No respiratory distress.  Abdominal: Soft. Bowel sounds are normal. He exhibits no distension. There is no abdominal tenderness. There is no rebound.  Obese.  Unable to assess HSM  Musculoskeletal: Normal range of motion.        General: Edema (2-3+ bilateral) present.  Lymphadenopathy:    He has no cervical adenopathy.  Neurological: He is alert and oriented to person, place, and time. No cranial nerve deficit.  Skin: Skin is warm and  dry.  Bilateral venous stasis changes with no obvious wounds.  Wraps in place but none TED hose.  Psychiatric:  Relatively slow to respond to questions.  His wife answers most questions.  He does have evidence of memory loss.  Relatively poor  historian.  Vitals reviewed.    Adult ECG Report  Rate: 59 ;  Rhythm: sinus bradycardia, sinus arrhythmia and Nonspecific ST of T wave changes.;   Narrative Interpretation: Borderline EKG   Other studies Reviewed: Additional studies/ records that were reviewed today include:  Recent Labs:  None evailable  ASSESSMENT / PLAN: Problem List Items Addressed This Visit    Bilateral lower extremity edema (Chronic)    Likely related to chronic lower extremity venous stasis. Continue support stockings/TED hose and/or wrap. Increasing Lasix every other day dosing alternating 20 to 40 mg each day. Also recommend elevating his feet when seated..      Chronic diastolic (congestive) heart failure (HCC) - Primary (Chronic)    I am not sure how much of his edema and dyspnea is related to CHF versus deconditioning and venous stasis with COPD and OSA.  He apparently recently had a stress test and echocardiogram, therefore I will wait and see what was done and when in order to determine if we need to recheck an echocardiogram.  He is currently on Toprol 25 mg and losartan which is appropriate for CHF.  This is in addition to 10 mg amlodipine. He still has pretty significant edema although seems to have improved. Plan: Increase Lasix to 40 to 40 mg alternating with 20 mg every other day.  (There was reluctant to do 40 every day because of frequent urinations and concern for dehydration) --which would argue against this being CHF related.       Relevant Medications   furosemide (LASIX) 20 MG tablet   Essential hypertension (Chronic)    Blood pressure is borderline elevated today on current meds.  With increased dose of Lasix may see improvement.  Would have to  consider converting from Toprol to carvedilol or convert to a more potent ARB in order to not add any medication since he is on max dose of ARB and amlodipine and max tolerable dose of beta-blocker with heart rate of 59.      Relevant Medications   furosemide (LASIX) 20 MG tablet   OSA (obstructive sleep apnea) (Chronic)    Not currently on CPAP or BiPAP.  Does not have a sleep medicine doctor locally.  If this is not been arranged by time I see him back, we can refer him to 1 of our sleep medicine physicians for reevaluation.      PAF (paroxysmal atrial fibrillation) (HCC) (Chronic)    As far as I can tell, he does not know if he is or is not in A. fib.  Currently not in A. fib.  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  Is on beta-blocker.  This patients CHA2DS2-VASc Score and unadjusted Ischemic Stroke Rate (% per year) is equal to 7.2 % stroke rate/year from a score of 5  Above score calculated as 1 point each if present [CHF, HTN, DM, Vascular=MI/PAD/Aortic Plaque, Age if 65-74, or Male] Above score calculated as 2 points each if present [Age > 75, or Stroke/TIA/TE]        Relevant Medications   furosemide (LASIX) 20 MG tablet       I spent a total of 50 minutes with the patient and chart review. >  50% of the time was spent in direct patient consultation.   Current medicines are reviewed at length with the patient today.  (+/- concerns) n/a The following changes have been made:  see below.  Patient Instructions  Medication Instructions:   ---furosemide (Lasix): alternate  taking 20 mg and 40 mg daily  If you need a refill on your cardiac medications before your next appointment, please call your pharmacy.   Follow-Up: At [facility], you and your health needs are our priority.  As part of our continuing mission to provide you with exceptional heart care, we have created designated Provider Care Teams.  These Care Teams include your primary Cardiologist (physician) and  Advanced Practice Providers (APPs -  Physician Assistants and Nurse Practitioners) who all work together to provide you with the care you need, when you need it. You will need a follow up appointment in 2 months.  Please call our office 2 months in advance to schedule this appointment.  You may see Glenetta Hew, MD or one of the following Advanced Practice Providers on your designated Care Team:   Rosaria Ferries, PA-C . Jory Sims, DNP, ANP     Studies Ordered:   No orders of the defined types were placed in this encounter.     Glenetta Hew, M.D., M.S. Interventional Cardiologist   Pager # [phone] Phone # [phone] [address] [place], [place] [address]   Thank you for choosing Heartcare at [facility]!!

## 2018-09-04 NOTE — Patient Instructions (Signed)
Medication Instructions:   ---furosemide (Lasix): alternate taking 20 mg and 40 mg daily  If you need a refill on your cardiac medications before your next appointment, please call your pharmacy.   Follow-Up: At [facility], you and your health needs are our priority.  As part of our continuing mission to provide you with exceptional heart care, we have created designated Provider Care Teams.  These Care Teams include your primary Cardiologist (physician) and Advanced Practice Providers (APPs -  Physician Assistants and Nurse Practitioners) who all work together to provide you with the care you need, when you need it. You will need a follow up appointment in 2 months.  Please call our office 2 months in advance to schedule this appointment.  You may see Glenetta Hew, MD or one of the following Advanced Practice Providers on your designated Care Team:   Rosaria Ferries, PA-C . Jory Sims, DNP, ANP

## 2018-09-05 ENCOUNTER — Telehealth: Payer: Self-pay | Admitting: Cardiology

## 2018-09-05 NOTE — Telephone Encounter (Signed)
Records received from [facility] on 09/05/18, Appt 11/06/18 @ 10:40AM. [place]

## 2018-09-06 ENCOUNTER — Ambulatory Visit: Payer: Medicare Other | Admitting: Endocrinology

## 2018-09-14 ENCOUNTER — Inpatient Hospital Stay (HOSPITAL_COMMUNITY): Admission: RE | Admit: 2018-09-14 | Payer: Medicare Other | Source: Ambulatory Visit

## 2018-09-18 ENCOUNTER — Encounter: Payer: Self-pay | Admitting: Cardiology

## 2018-09-18 DIAGNOSIS — I10 Essential (primary) hypertension: Secondary | ICD-10-CM | POA: Insufficient documentation

## 2018-09-18 DIAGNOSIS — Z789 Other specified health status: Secondary | ICD-10-CM | POA: Insufficient documentation

## 2018-09-18 DIAGNOSIS — R600 Localized edema: Secondary | ICD-10-CM

## 2018-09-18 DIAGNOSIS — I5032 Chronic diastolic (congestive) heart failure: Secondary | ICD-10-CM | POA: Insufficient documentation

## 2018-09-18 DIAGNOSIS — G4733 Obstructive sleep apnea (adult) (pediatric): Secondary | ICD-10-CM | POA: Insufficient documentation

## 2018-09-18 DIAGNOSIS — I878 Other specified disorders of veins: Secondary | ICD-10-CM | POA: Insufficient documentation

## 2018-09-18 DIAGNOSIS — I480 Paroxysmal atrial fibrillation: Secondary | ICD-10-CM

## 2018-09-18 NOTE — Assessment & Plan Note (Signed)
I am not sure how much of his edema and dyspnea is related to CHF versus deconditioning and venous stasis with COPD and OSA.  He apparently recently had a stress test and echocardiogram, therefore I will wait and see what was done and when in order to determine if we need to recheck an echocardiogram.  He is currently on Toprol 25 mg and losartan which is appropriate for CHF.  This is in addition to 10 mg amlodipine. He still has pretty significant edema although seems to have improved. Plan: Increase Lasix to 40 to 40 mg alternating with 20 mg every other day.  (There was reluctant to do 40 every day because of frequent urinations and concern for dehydration) --which would argue against this being CHF related.

## 2018-09-18 NOTE — Assessment & Plan Note (Signed)
As far as I can tell, he does not know if he is or is not in A. fib.  Currently not in A. fib.  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  Is on beta-blocker.  This patients CHA2DS2-VASc Score and unadjusted Ischemic Stroke Rate (% per year) is equal to 7.2 % stroke rate/year from a score of 5  Above score calculated as 1 point each if present [CHF, HTN, DM, Vascular=MI/PAD/Aortic Plaque, Age if 65-74, or Male] Above score calculated as 2 points each if present [Age > 75, or Stroke/TIA/TE]

## 2018-09-18 NOTE — Assessment & Plan Note (Signed)
Likely related to chronic lower extremity venous stasis. Continue support stockings/TED hose and/or wrap. Increasing Lasix every other day dosing alternating 20 to 40 mg each day. Also recommend elevating his feet when seated.Jeffery [name]

## 2018-09-18 NOTE — Assessment & Plan Note (Signed)
Blood pressure is borderline elevated today on current meds.  With increased dose of Lasix may see improvement.  Would have to consider converting from Toprol to carvedilol or convert to a more potent ARB in order to not add any medication since he is on max dose of ARB and amlodipine and max tolerable dose of beta-blocker with heart rate of 59.

## 2018-09-18 NOTE — Assessment & Plan Note (Signed)
Not currently on CPAP or BiPAP.  Does not have a sleep medicine doctor locally.  If this is not been arranged by time I see him back, we can refer him to 1 of our sleep medicine physicians for reevaluation.

## 2018-09-24 ENCOUNTER — Encounter (HOSPITAL_COMMUNITY): Payer: Self-pay | Admitting: Emergency Medicine

## 2018-09-24 ENCOUNTER — Emergency Department (HOSPITAL_COMMUNITY): Payer: Medicare Other

## 2018-09-24 ENCOUNTER — Other Ambulatory Visit: Payer: Self-pay

## 2018-09-24 ENCOUNTER — Emergency Department (HOSPITAL_COMMUNITY)
Admission: EM | Admit: 2018-09-24 | Discharge: 2018-09-24 | Disposition: A | Discharge status: Home / Self Care | Payer: Medicare Other | Attending: Emergency Medicine | Admitting: Emergency Medicine

## 2018-09-24 DIAGNOSIS — Z79899 Other long term (current) drug therapy: Secondary | ICD-10-CM | POA: Diagnosis not present

## 2018-09-24 DIAGNOSIS — I5032 Chronic diastolic (congestive) heart failure: Secondary | ICD-10-CM | POA: Diagnosis not present

## 2018-09-24 DIAGNOSIS — I509 Heart failure, unspecified: Secondary | ICD-10-CM

## 2018-09-24 DIAGNOSIS — J449 Chronic obstructive pulmonary disease, unspecified: Secondary | ICD-10-CM | POA: Diagnosis not present

## 2018-09-24 DIAGNOSIS — I110 Hypertensive heart disease with heart failure: Secondary | ICD-10-CM | POA: Insufficient documentation

## 2018-09-24 DIAGNOSIS — E119 Type 2 diabetes mellitus without complications: Secondary | ICD-10-CM | POA: Diagnosis not present

## 2018-09-24 DIAGNOSIS — Z87891 Personal history of nicotine dependence: Secondary | ICD-10-CM | POA: Insufficient documentation

## 2018-09-24 DIAGNOSIS — R4182 Altered mental status, unspecified: Secondary | ICD-10-CM

## 2018-09-24 DIAGNOSIS — Z794 Long term (current) use of insulin: Secondary | ICD-10-CM | POA: Insufficient documentation

## 2018-09-24 DIAGNOSIS — R39198 Other difficulties with micturition: Secondary | ICD-10-CM | POA: Diagnosis present

## 2018-09-24 LAB — COMPREHENSIVE METABOLIC PANEL
ALBUMIN: 2.3 g/dL — AB (ref 3.5–5.0)
ALK PHOS: 73 U/L (ref 38–126)
ALT: 15 U/L (ref 0–44)
ANION GAP: 7 (ref 5–15)
AST: 19 U/L (ref 15–41)
BILIRUBIN TOTAL: 0.2 mg/dL — AB (ref 0.3–1.2)
BUN: 35 mg/dL — ABNORMAL HIGH (ref 8–23)
CALCIUM: 8.8 mg/dL — AB (ref 8.9–10.3)
CO2: 21 mmol/L — ABNORMAL LOW (ref 22–32)
Chloride: 109 mmol/L (ref 98–111)
Creatinine, Ser: 2.42 mg/dL — ABNORMAL HIGH (ref 0.61–1.24)
GFR calc Af Amer: 29 mL/min — ABNORMAL LOW (ref >60)
GFR, EST NON AFRICAN AMERICAN: 25 mL/min — AB (ref >60)
GLUCOSE: 141 mg/dL — AB (ref 70–99)
POTASSIUM: 4.6 mmol/L (ref 3.5–5.1)
Sodium: 137 mmol/L (ref 135–145)
TOTAL PROTEIN: 6 g/dL — AB (ref 6.5–8.1)

## 2018-09-24 LAB — CBC WITH DIFFERENTIAL/PLATELET
Abs Immature Granulocytes: 0.09 10*3/uL — ABNORMAL HIGH (ref 0.00–0.07)
BASOS ABS: 0 10*3/uL (ref 0.0–0.1)
Basophils Relative: 0 %
EOS ABS: 0.3 10*3/uL (ref 0.0–0.5)
Eosinophils Relative: 3 %
HEMATOCRIT: 33.7 % — AB (ref 39.0–52.0)
HEMOGLOBIN: 10.9 g/dL — AB (ref 13.0–17.0)
IMMATURE GRANULOCYTES: 1 %
LYMPHS ABS: 2 10*3/uL (ref 0.7–4.0)
LYMPHS PCT: 17 %
MCH: 31.8 pg (ref 26.0–34.0)
MCHC: 32.3 g/dL (ref 30.0–36.0)
MCV: 98.3 fL (ref 80.0–100.0)
Monocytes Absolute: 1.1 10*3/uL — ABNORMAL HIGH (ref 0.1–1.0)
Monocytes Relative: 10 %
NEUTROS PCT: 69 %
Neutro Abs: 8.1 10*3/uL — ABNORMAL HIGH (ref 1.7–7.7)
Platelets: 265 10*3/uL (ref 150–400)
RBC: 3.43 MIL/uL — AB (ref 4.22–5.81)
RDW: 15.3 % (ref 11.5–15.5)
WBC: 11.6 10*3/uL — ABNORMAL HIGH (ref 4.0–10.5)
nRBC: 0.2 % (ref 0.0–0.2)

## 2018-09-24 LAB — I-STAT VENOUS BLOOD GAS, ED
ACID-BASE DEFICIT: 2 mmol/L (ref 0.0–2.0)
Bicarbonate: 23.4 mmol/L (ref 20.0–28.0)
O2 Saturation: 60 %
Patient temperature: 98
TCO2: 25 mmol/L (ref 22–32)
pCO2, Ven: 40.8 mmHg — ABNORMAL LOW (ref 44.0–60.0)
pH, Ven: 7.364 (ref 7.250–7.430)
pO2, Ven: 32 mmHg (ref 32.0–45.0)

## 2018-09-24 LAB — BRAIN NATRIURETIC PEPTIDE: B Natriuretic Peptide: 195.2 pg/mL — ABNORMAL HIGH (ref 0.0–100.0)

## 2018-09-24 LAB — URINALYSIS, ROUTINE W REFLEX MICROSCOPIC
Bacteria, UA: NONE SEEN
Bilirubin Urine: NEGATIVE
GLUCOSE, UA: 50 mg/dL — AB
Hgb urine dipstick: NEGATIVE
Ketones, ur: NEGATIVE mg/dL
LEUKOCYTES UA: NEGATIVE
Nitrite: NEGATIVE
PH: 6 (ref 5.0–8.0)
Protein, ur: 100 mg/dL — AB
SPECIFIC GRAVITY, URINE: 1.012 (ref 1.005–1.030)

## 2018-09-24 LAB — AMMONIA: Ammonia: 13 umol/L (ref 9–35)

## 2018-09-24 LAB — INFLUENZA PANEL BY PCR (TYPE A & B)
Influenza A By PCR: NEGATIVE
Influenza B By PCR: NEGATIVE

## 2018-09-24 MED ORDER — FUROSEMIDE 10 MG/ML IJ SOLN
40.0000 mg | Freq: Once | INTRAMUSCULAR | Status: AC
  Administered 2018-09-24: 40 mg via INTRAVENOUS
  Filled 2018-09-24: qty 4

## 2018-09-24 NOTE — ED Notes (Signed)
Pt walked to restroom. Pt refused to let staff help him and family witnessed same. He is in restroom now.

## 2018-09-24 NOTE — ED Provider Notes (Signed)
[facility]ERGENCY DEPARTMENT Provider Note   CSN: [id number] Arrival date & time: 09/24/18  1505     History   Chief Complaint Chief Complaint  [name] presents with  . ALOC    HPI Jeff[facility] [name] is a 78 y.o. male.  Jeffery history is provided by Jeffery [name], Jeffery spouse and medical records. No language interpreter was used.   [facility] [name] is a 78 y.o. male  with a PMH [name] IgG subclass deficiency, COPD, HTN, DM, paroxysmal A. fib on Xarelto who presents to [facility] complaining [name] not feeling well for Jeffery last few days.  Wife at bedside providing most [name] Jeffery history.  She states that he seems altered to her and this has been ongoing for Jeffery last 2 days.  This is typically how he acts when he has a UTI.  He was seen by Jeffery doctor yesterday who did not check a urinalysis or any blood work, but did tell them that he likely had a UTI.  They started him on Cipro.  He took 2 doses yesterday and 1 this morning.  Wife states that he has not gotten any better and is concerned about his behavior.  She states that he just lays in Jeffery bed and moans which is atypical for him.  He feels as if it is difficult for him to urinate.  He does have history [name] BPH and was out [name] his Flomax for several days.  This is now been refilled.  He denies any nausea or vomiting.  No back pain.  No fevers.  He did have a fall about a week ago.  He does not believe that he hit his head.  He is on anticoagulation as mentioned above.    Past Medical History:  Diagnosis Date  . Angio-edema   . BPH (benign prostatic hyperplasia)   . Cognitive deficits   . COPD (chronic obstructive pulmonary disease) ([place])   . Degeneration [name] lumbar intervertebral disc   . Diabetes mellitus with neuropathy ([place])   . Hypertension   . Immunodeficiency syndrome ([place])   . Kidney disease   . Lump   . Metabolic syndrome   . Obesity   . Osteoarthritis   . Paroxysmal atrial fibrillation (HCC)   .  Personal history [name] venous thrombosis and embolism   . Pituitary neoplasm   . S/P splenectomy   . Selective deficiency [name] IgG ([place])   . Sleep apnea   . Venous insufficiency (chronic) (peripheral)     [name] Active Problem List   Diagnosis Date Noted  . OSA (obstructive sleep apnea) 09/18/2018  . Essential hypertension 09/18/2018  . Bilateral lower extremity edema 09/18/2018  . Chronic diastolic (congestive) heart failure ([place]) 09/18/2018  . PAF (paroxysmal atrial fibrillation) ([place]) 09/18/2018  . Generalized abdominal pain 08/10/2018  . 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 Chronic obstructive pulmonary disease 06/07/2018  . Chronic rhinitis 06/07/2018    Past Surgical History:  Procedure Laterality Date  . APPENDECTOMY    . GALLBLADDER SURGERY    . HERNIA REPAIR    . LUMBAR DISC SURGERY    . PAIN PUMP IMPLANTATION    . SPLENECTOMY, TOTAL    . TEMPORAL ARTERY BIOPSY / LIGATION    . TONSILLECTOMY          Home Medications    Prior to Admission medications   Medication Sig Start Date End Date Taking? Authorizing Provider  allopurinol (ZYLOPRIM) 300 MG tablet Take 300 mg  by mouth daily.  05/10/18   [provider]  amLODipine (NORVASC) 10 MG tablet Take 10 mg by mouth daily.  04/08/18   [provider]  calcitRIOL (ROCALTROL) 0.25 MCG capsule Take 0.25 mcg by mouth daily.  03/31/18   [provider]  donepezil (ARICEPT) 10 MG tablet Take 10 mg by mouth at bedtime.  04/23/18   [provider]  finasteride (PROSCAR) 5 MG tablet Take 5 mg by mouth daily.  03/09/18   [provider]  Fluticasone-Umeclidin-Vilant (TRELEGY ELLIPTA IN) Inhale 2 puffs into Jeffery lungs daily.     [provider]  furosemide (LASIX) 20 MG tablet Alternate taking 20 mg daily and 40 mg daily 09/04/18   Leonie Man, MD  gabapentin (NEURONTIN) 300 MG capsule Take 300 mg by mouth 3 (three) times daily as needed.  05/29/18   [provider]   GAMMAGARD 20 GM/200ML SOLN every 30 (thirty) days.  05/01/18   [provider]  insulin glargine (LANTUS) 100 UNIT/ML injection Inject 30 Units into Jeffery skin every morning.    [provider]  insulin lispro (HUMALOG KWIKPEN) 100 UNIT/ML KiwkPen Inject into Jeffery skin See admin instructions. Sliding scale. 1-4 times daily.    [provider]  losartan (COZAAR) 100 MG tablet Take 100 mg by mouth daily.  04/21/18   [provider]  metoprolol succinate (TOPROL-XL) 25 MG 24 hr tablet Take 25 mg by mouth daily.  03/09/18   [provider]  montelukast (SINGULAIR) 10 MG tablet Take 10 mg by mouth at bedtime.    [provider]  omeprazole (PRILOSEC) 10 MG capsule Take by mouth daily.     [provider]  oxyCODONE (ROXICODONE) 15 MG immediate release tablet Take 15 mg by mouth every 6 (six) hours as needed for pain.    [provider]  OXYCONTIN 15 MG 12 hr tablet Take 15 mg by mouth every 12 (twelve) hours.  05/10/18   [provider]  tamsulosin (FLOMAX) 0.4 MG CAPS capsule Take 0.4 mg by mouth daily.  04/07/18   [provider]  XARELTO 15 MG TABS tablet Take 15 mg by mouth daily with supper.  04/07/18   [provider]    Family History Family History  Family history unknown: Yes    Social History Social History   Tobacco Use  . Smoking status: Former Smoker    Last attempt to quit: 06/07/1988    Years since quitting: 30.3  . Smokeless tobacco: Never Used  Substance Use Topics  . Alcohol use: Never    Frequency: Never  . Drug use: Never     Allergies   Betadine [povidone iodine]; Keflex [cephalexin]; and Penicillins   Review [name] Systems Review [name] Systems  Genitourinary: Positive for difficulty urinating.  All other systems reviewed and are negative.    Physical Exam Updated Vital Signs BP 129/87   Pulse 69   Temp 98 F (36.7 C) (Rectal)   Resp 15   Ht 5\' 7"  (1.702 m)   Wt 95 kg    SpO2 100%   BMI 32.80 kg/m   Physical Exam Vitals signs and nursing note reviewed.  Constitutional:      General: He is not in acute distress.    Appearance: He is well-developed.  HENT:     Head: Normocephalic and atraumatic.  Cardiovascular:     Rate and Rhythm: Normal rate and regular rhythm.     Heart sounds: Normal heart  sounds. No murmur.  Pulmonary:     Effort: Pulmonary effort is normal. No respiratory distress.     Breath sounds: Normal breath sounds.  Abdominal:     General: There is no distension.     Palpations: Abdomen is soft.     Tenderness: There is no abdominal tenderness.  Skin:    General: Skin is warm and dry.  Neurological:     Mental Status: He is alert and oriented to person, place, and time.     Cranial Nerves: No cranial nerve deficit.      ED Treatments / Results  Labs (all labs ordered are listed, but only abnormal results are displayed) Labs Reviewed  CBC WITH DIFFERENTIAL/PLATELET - Abnormal; Notable for Jeffery following components:      Result Value   WBC 11.6 (*)    RBC 3.43 (*)    Hemoglobin 10.9 (*)    HCT 33.7 (*)    Neutro Abs 8.1 (*)    Monocytes Absolute 1.1 (*)    Abs Immature Granulocytes 0.09 (*)    All other components within normal limits  COMPREHENSIVE METABOLIC PANEL - Abnormal; Notable for Jeffery following components:   CO2 21 (*)    Glucose, Bld 141 (*)    BUN 35 (*)    Creatinine, Ser 2.42 (*)    Calcium 8.8 (*)    Total Protein 6.0 (*)    Albumin 2.3 (*)    Total Bilirubin 0.2 (*)    GFR calc non Af Amer 25 (*)    GFR calc Af Amer 29 (*)    All other components within normal limits  URINALYSIS, ROUTINE W REFLEX MICROSCOPIC - Abnormal; Notable for Jeffery following components:   Glucose, UA 50 (*)    Protein, ur 100 (*)    All other components within normal limits  BRAIN NATRIURETIC PEPTIDE - Abnormal; Notable for Jeffery following components:   B Natriuretic Peptide 195.2 (*)    All other components within normal  limits  I-STAT VENOUS BLOOD GAS, ED - Abnormal; Notable for Jeffery following components:   pCO2, Ven 40.8 (*)    All other components within normal limits  URINE CULTURE  INFLUENZA PANEL BY PCR (TYPE A & B)  AMMONIA    EKG None  Radiology Dg Chest 2 View  Result Date: 09/24/2018 CLINICAL DATA:  Chills today. EXAM: CHEST - 2 VIEW COMPARISON:  None. FINDINGS: Jeffery [name] is rotated to Jeffery left on Jeffery study. There is cardiomegaly and vascular congestion. No consolidative process, pneumothorax or effusion. No acute or focal bony abnormality. IMPRESSION: Cardiomegaly and pulmonary vascular congestion. Electronically Signed   By: Inge Rise M.D.   On: 09/24/2018 17:09   Ct Head Wo Contrast  Result Date: 09/24/2018 CLINICAL DATA:  Pt presents to ED from home via [facility]. Pt was diagnosed with a UTI yesterday and given antibiotics. Pts wife states he is normally altered with a UTI but seems more altered today EXAM: CT HEAD WITHOUT CONTRAST TECHNIQUE: Contiguous axial images were obtained from Jeffery base [name] Jeffery skull through Jeffery vertex without intravenous contrast. COMPARISON:  None. FINDINGS: Brain: No evidence [name] acute infarction, hemorrhage, hydrocephalus, extra-axial collection or mass lesion/mass effect. There is ventricular and sulcal enlargement reflecting mild to moderate diffuse atrophy. Mild white matter hypoattenuation is noted consistent with chronic microvascular ischemic change. Vascular: No hyperdense vessel or unexpected calcification. Skull: Unilateral thinned left parietal bone. No fracture. No osteoblastic or osteolytic lesions. Sinuses/Orbits: Visualized globes and orbits are unremarkable.  Jeffery visualized sinuses and mastoid air cells are clear. Other: None. IMPRESSION: 1. No acute intracranial abnormalities. 2. Mild moderate atrophy and mild chronic microvascular ischemic change. Electronically Signed   By: Lajean Manes M.D.   On: 09/24/2018 16:49    Procedures Procedures (including  critical care time)  Medications Ordered in ED Medications  furosemide (LASIX) injection 40 mg (has no administration in time range)     Initial Impression / Assessment and Plan / ED Course  I have reviewed Jeffery triage vital signs and Jeffery nursing notes.  Pertinent labs & imaging results that were available during my care [name] [facility] [name] were reviewed by me and considered in my medical decision making (see chart for details).    Armstrong Creasy is a 78 y.o. male who presents to ED for not feeling well. Wife answers Jeffery majority [name] questions, stating that he is not as alert as he usually is. He is moaning and just saying yes instead [name] communicating with her. To me, he is A&Ox4 and answering questions when wife doesn't answer them first. He is afebrile and hemodynamically stable. He is non-toxic appearing with benign abdominal exam. Wife concerned for UTI as etiology. Seen by doctor yesterday where a urine sample was not obtained, but doctor placed him on Cipro for possible UTI.  His urinalysis today is reassuring with no bacteria and negative leuks.  CT head with no acute findings.  Flu negative.  Chest x-ray without signs [name] infection, but does show pulmonary vascular congestion.  BNP elevated at 195.2.  [name] is supposedly donating between 20 mg and 40 mg [name] Lasix daily. ?  Compliance with regimen as family appears to be fixated that he is dehydrated and taking too much fluid off with his pills.  He has a history [name] chronic lower extremity edema per chart review, so is difficult to assess acute versus chronic nature [name] his edema today.  He is not experiencing any shortness [name] breath and oxygenating well. Recommended admission for chf exacerbation. I spoke with Jeffery family and [name] about this AT LENGTH. I discussed reasons I recommended admission and risks [name] outpatient care. [name] is adamant that he is going home tonight.  Given 40 mg [name] Lasix IV.  Recommend that he take 40 mg p.o. for Jeffery next 3  days.  He should call his cardiologist first thing in Jeffery morning to schedule a close follow-up appointment, preferably in Jeffery next day or 2, but definitely this week.  I discussed this with [name] and family at bedside who understand.  We discussed reasons he should return to Jeff[facility] emergency department and Jeffery fact that he can return at any time should he feel that he needs to.  All questions were answered to Jeffery best my ability.  [name] seen by and discussed with Dr. Johnney Killian who agrees with treatment plan.    Final Clinical Impressions(s) / ED Diagnoses   Final diagnoses:  Altered mental state  Congestive heart failure, unspecified HF chronicity, unspecified heart failure type [facility])    ED Discharge Orders    None       Halyn Flaugher, Ozella Almond, PA-C 09/24/18 2047    Charlesetta Shanks, MD 09/24/18 2102

## 2018-09-24 NOTE — ED Notes (Signed)
Patient verbalizes understanding of discharge instructions. Opportunity for questioning and answers were provided. Armband removed by staff, pt discharged from ED via wheelchair to home.  

## 2018-09-24 NOTE — Discharge Instructions (Signed)
Take 40 mg of your lasix daily for the next 3 days.   Call your cardiologist office tomorrow morning to schedule a follow up appointment this week.   Return to ER for new or worsening symptoms, any additional concerns.

## 2018-09-24 NOTE — ED Triage Notes (Signed)
Pt presents to ED from home via [facility]. Pt was diagnosed with a UTI yesterday and given antibiotics. Pts wife states he is normally altered with a UTI but seems more altered today.   BP 116/64 96%RA HR 60 RR 22

## 2018-09-24 NOTE — ED Notes (Signed)
DR Johnney Killian informed of venous blood gas results

## 2018-09-25 ENCOUNTER — Ambulatory Visit (HOSPITAL_COMMUNITY)
Admission: RE | Admit: 2018-09-25 | Discharge: 2018-09-25 | Disposition: A | Discharge status: Home / Self Care | Payer: Medicare Other | Source: Ambulatory Visit | Attending: Allergy & Immunology | Admitting: Allergy & Immunology

## 2018-09-25 ENCOUNTER — Telehealth: Payer: Self-pay | Admitting: Allergy & Immunology

## 2018-09-25 DIAGNOSIS — D803 Selective deficiency of immunoglobulin G [IgG] subclasses: Secondary | ICD-10-CM | POA: Diagnosis not present

## 2018-09-25 DIAGNOSIS — Z7689 Persons encountering health services in other specified circumstances: Secondary | ICD-10-CM | POA: Diagnosis present

## 2018-09-25 LAB — URINE CULTURE: Culture: NO GROWTH

## 2018-09-25 MED ORDER — DIPHENHYDRAMINE HCL 25 MG PO CAPS
25.0000 mg | ORAL_CAPSULE | ORAL | Status: DC
  Administered 2018-09-25: 25 mg via ORAL

## 2018-09-25 MED ORDER — ACETAMINOPHEN 325 MG PO TABS
ORAL_TABLET | ORAL | Status: AC
  Filled 2018-09-25: qty 2

## 2018-09-25 MED ORDER — IMMUNE GLOBULIN (HUMAN) 20 GM/200ML IV SOLN
30.0000 g | INTRAVENOUS | Status: DC
  Administered 2018-09-25: 30 g via INTRAVENOUS
  Filled 2018-09-25: qty 100

## 2018-09-25 MED ORDER — DIPHENHYDRAMINE HCL 25 MG PO CAPS
ORAL_CAPSULE | ORAL | Status: AC
  Filled 2018-09-25: qty 1

## 2018-09-25 MED ORDER — ACETAMINOPHEN 325 MG PO TABS
650.0000 mg | ORAL_TABLET | ORAL | Status: DC
  Administered 2018-09-25: 650 mg via ORAL

## 2018-09-25 NOTE — Telephone Encounter (Signed)
This was relayed via phone to the infusion center

## 2018-09-25 NOTE — Telephone Encounter (Signed)
We received a call from the infusion center regarding the IVIG infusion for Jeffery [name]. He was seen in the ED yesterday for AMS, felt to be a combination of fluid overload as well as a UTI. He was also diagnosed with a CHF exacerbation. He was started on Lasix and the question the infusion center has today is whether we should go ahead with the infusion especially in light of the fact that we pre-medicate with IVF prior to the IVIG.   Because of the concern for fluid overload, I asked them to hold off on the pre-hydration to save the extra fluid exposure.   Salvatore Marvel, MD [facility]

## 2018-09-27 ENCOUNTER — Encounter: Payer: Self-pay | Admitting: Endocrinology

## 2018-09-27 ENCOUNTER — Ambulatory Visit (INDEPENDENT_AMBULATORY_CARE_PROVIDER_SITE_OTHER): Payer: Medicare Other | Admitting: Endocrinology

## 2018-09-27 DIAGNOSIS — Z794 Long term (current) use of insulin: Secondary | ICD-10-CM | POA: Diagnosis not present

## 2018-09-27 DIAGNOSIS — N184 Chronic kidney disease, stage 4 (severe): Secondary | ICD-10-CM

## 2018-09-27 DIAGNOSIS — E1122 Type 2 diabetes mellitus with diabetic chronic kidney disease: Secondary | ICD-10-CM | POA: Diagnosis not present

## 2018-09-27 DIAGNOSIS — E119 Type 2 diabetes mellitus without complications: Secondary | ICD-10-CM | POA: Insufficient documentation

## 2018-09-27 LAB — POCT GLYCOSYLATED HEMOGLOBIN (HGB A1C): Hemoglobin A1C: 6.8 % — AB (ref 4.0–5.6)

## 2018-09-27 MED ORDER — INSULIN GLARGINE 100 UNIT/ML SOLOSTAR PEN: 25.0000 [IU] | PEN_INJECTOR | SUBCUTANEOUS | 11 refills | Status: DC

## 2018-09-27 MED ORDER — INSULIN LISPRO (1 UNIT DIAL) 100 UNIT/ML (KWIKPEN): 4.0000 [IU] | PEN_INJECTOR | Freq: Three times a day (TID) | SUBCUTANEOUS | 11 refills | Status: AC

## 2018-09-27 NOTE — Patient Instructions (Addendum)
good diet and exercise significantly improve the control of your diabetes.  please let me know if you wish to be referred to a dietician.  high blood sugar is very risky to your health.  you should see an eye doctor and dentist every year.  It is very important to get all recommended vaccinations.  Controlling your blood pressure and cholesterol drastically reduces the damage diabetes does to your body.  Those who smoke should quit.  Please discuss these with your doctor.  check your blood sugar twice a day.  vary the time of day when you check, between before the 3 meals, and at bedtime.  also check if you have symptoms of your blood sugar being too high or too low.  please keep a record of the readings and bring it to your next appointment here (or you can bring the meter itself).  You can write it on any piece of paper.  please call us sooner if your blood sugar goes below 70, or if you have a lot of readings over 200. Please reduce the lantus to 25 units daily, and:  Take 4 units of humalog, 3 times a day (just before each meal), no matter what the blood sugar is.  Please come back for a follow-up appointment in 2 months.

## 2018-09-27 NOTE — Progress Notes (Signed)
Subjective:    Patient ID: [facility] [name], male    DOB: 18-Aug-1941, 78 y.o.   MRN: [id number]  HPI pt is referred by Dr Mitchel Honour, for diabetes.  Pt states DM was dx'ed in 1992; he has moderate polyneuropathy of the lower extremities, and assoc pain; he also has renal failure and foot ulcer; he has been on insulin since 1994; pt says his diet is good, but exercise is limited by health problems; he has never had pancreatitis, pancreatic surgery, severe hypoglycemia or DKA.  He takes lantus 30 units qam, and PRN humalog (averages approx 8 total units/day).  He has mild hypoglycemia approx 2/month.  This usually happens fasting).   Past Medical History:  Diagnosis Date  . Angio-edema   . BPH (benign prostatic hyperplasia)   . Cognitive deficits   . COPD (chronic obstructive pulmonary disease) ([place])   . Degeneration of lumbar intervertebral disc   . Diabetes mellitus with neuropathy ([place])   . Hypertension   . Immunodeficiency syndrome ([place])   . Kidney disease   . Lump   . Metabolic syndrome   . Obesity   . Osteoarthritis   . Paroxysmal atrial fibrillation ([facility])   . Personal history of venous thrombosis and embolism   . Pituitary neoplasm   . S/P splenectomy   . Selective deficiency of IgG ([place])   . Sleep apnea   . Venous insufficiency (chronic) (peripheral)     Past Surgical History:  Procedure Laterality Date  . APPENDECTOMY    . GALLBLADDER SURGERY    . HERNIA REPAIR    . LUMBAR DISC SURGERY    . PAIN PUMP IMPLANTATION    . SPLENECTOMY, TOTAL    . TEMPORAL ARTERY BIOPSY / LIGATION    . TONSILLECTOMY      Social History   Socioeconomic History  . Marital status: Married    Spouse name: Not on file  . Number of children: 4  . Years of education: Not on file  . Highest education level: Not on file  Occupational History  . Occupation: ret[employer]     Comment: Former Curator  Social Needs  . Financial resource strain: Not on file  . Food insecurity:   Worry: Not on file    Inability: Not on file  . Transportation needs:    Medical: Not on file    Non-medical: Not on file  Tobacco Use  . Smoking status: Former Smoker    Last attempt to quit: 06/07/1988    Years since quitting: 30.3  . Smokeless tobacco: Never Used  Substance and Sexual Activity  . Alcohol use: Never    Frequency: Never  . Drug use: Never  . Sexual activity: Not Currently  Lifestyle  . Physical activity:    Days per week: Not on file    Minutes per session: Not on file  . Stress: Not on file  Relationships  . Social connections:    Talks on phone: Not on file    Gets together: Not on file    Attends religious service: Not on file    Active member of club or organization: Not on file    Attends meetings of clubs or organizations: Not on file    Relationship status: Not on file  . Intimate partner violence:    Fear of current or ex partner: Not on file    Emotionally abused: Not on file    Physically abused: Not on file    Forced sexual activity:  Not on file  Other Topics Concern  . Not on file  Social History Narrative   He and his wife moved to [place] roughly 2 years ago to be close to their children.  They have 4 adult children that live in [place], so they moved to be closer to the family.   [name] is not very active mostly related to arthritis and low back pain.   They currently reside in an independent living facility.    Current Outpatient Medications on File Prior to Visit  Medication Sig Dispense Refill  . allopurinol (ZYLOPRIM) 300 MG tablet Take 300 mg by mouth daily.     Marland Kitchen amLODipine (NORVASC) 10 MG tablet Take 10 mg by mouth daily.     . calcitRIOL (ROCALTROL) 0.25 MCG capsule Take 0.25 mcg by mouth daily.     Marland Kitchen donepezil (ARICEPT) 10 MG tablet Take 10 mg by mouth at bedtime.     . finasteride (PROSCAR) 5 MG tablet Take 5 mg by mouth daily.     . Fluticasone-Umeclidin-Vilant (TRELEGY ELLIPTA IN) Inhale 2 puffs into the lungs daily.     .  furosemide (LASIX) 20 MG tablet Alternate taking 20 mg daily and 40 mg daily 90 tablet 3  . gabapentin (NEURONTIN) 300 MG capsule Take 300 mg by mouth 3 (three) times daily as needed.     Marland Kitchen GAMMAGARD 20 GM/200ML SOLN every 30 (thirty) days.     Marland Kitchen losartan (COZAAR) 100 MG tablet Take 100 mg by mouth daily.     . metoprolol succinate (TOPROL-XL) 25 MG 24 hr tablet Take 25 mg by mouth daily.     . montelukast (SINGULAIR) 10 MG tablet Take 10 mg by mouth at bedtime.    Marland Kitchen omeprazole (PRILOSEC) 10 MG capsule Take by mouth daily.     Marland Kitchen oxyCODONE (ROXICODONE) 15 MG immediate release tablet Take 15 mg by mouth every 6 (six) hours as needed for pain.    . OXYCONTIN 15 MG 12 hr tablet Take 15 mg by mouth every 12 (twelve) hours.     . tamsulosin (FLOMAX) 0.4 MG CAPS capsule Take 0.4 mg by mouth daily.     Alveda Reasons 15 MG TABS tablet Take 15 mg by mouth daily with supper.      No current facility-administered medications on file prior to visit.     Allergies  Allergen Reactions  . Betadine [Povidone Iodine] Rash  . Keflex [Cephalexin] Rash  . Penicillins Rash    Family History  Problem Relation Age of Onset  . Diabetes Brother     BP 138/68 (BP Location: Right Arm, Patient Position: Sitting, Cuff Size: Normal)   Pulse (!) 58   Ht 5\' 7"  (1.702 m)   Wt 203 lb (92.1 kg)   SpO2 95%   BMI 31.79 kg/m   Review of Systems denies weight loss, blurry vision, headache, chest pain, n/v, urinary frequency, excessive diaphoresis, memory loss, depression, cold intolerance, rhinorrhea, and easy bruising.  He has chronic sob, leg cramps, easy bruising, and fatigue.       Objective:   Physical Exam VS: see vs page GEN: no distress HEAD: head: no deformity eyes: no periorbital swelling, no proptosis external nose and ears are normal mouth: no lesion seen NECK: supple, thyroid is not enlarged CHEST WALL: no deformity LUNGS: clear to auscultation CV: reg rate and rhythm, no murmur ABD: abdomen is  soft, nontender.  no hepatosplenomegaly.  not distended.  no hernia.  Infusion pump is  noted at the [facility]).   MUSCULOSKELETAL: muscle bulk and strength are grossly normal.  no obvious joint swelling.  gait is steady with a walker EXTEMITIES: no deformity. ulcer is noted on the right great toe (sees podiatry tomorrow), and bilat calluses.  feet are of normal color and temp.  Trace bilat leg edema, and bilat vv's.   PULSES: dorsalis pedis intact bilat.  no carotid bruit.   NEURO:  cn 2-12 grossly intact.   readily moves all 4's.  sensation is intact to touch on the feet.  SKIN:  Normal texture and temperature.  No rash or suspicious lesion is visible.  Few ecchymoses on the forearms.   NODES:  None palpable at the neck.  PSYCH: alert, well-oriented.  Does not appear anxious nor depressed.    Lab Results  Component Value Date   CREATININE 2.42 (H) 09/24/2018   BUN 35 (H) 09/24/2018   NA 137 09/24/2018   K 4.6 09/24/2018   CL 109 09/24/2018   CO2 21 (L) 09/24/2018   Lab Results  Component Value Date   HGBA1C 6.8 (A) 09/27/2018    I have reviewed outside records, and summarized: Pt was noted to have elevated a1c, and referred here.  He also has h/o pituitary adenoma.  Which was stable on f/u, when she lived in [place].      Assessment & Plan:  Insulin-requiring type 2 DM, with foot ulcer.  Overcontrolled, given this regimen, which does match insulin to his changing needs throughout the day.  Renal failure, new to me: he needs mostly mealtime insulin.  This is supported by pattern of his cbg's.    Patient Instructions  good diet and exercise significantly improve the control of your diabetes.  please let me know if you wish to be referred to a dietician.  high blood sugar is very risky to your health.  you should see an eye doctor and dentist every year.  It is very important to get all recommended vaccinations.  Controlling your blood pressure and cholesterol drastically reduces the damage  diabetes does to your body.  Those who smoke should quit.  Please discuss these with your doctor.  check your blood sugar twice a day.  vary the time of day when you check, between before the 3 meals, and at bedtime.  also check if you have symptoms of your blood sugar being too high or too low.  please keep a record of the readings and bring it to your next appointment here (or you can bring the meter itself).  You can write it on any piece of paper.  please call us sooner if your blood sugar goes below 70, or if you have a lot of readings over 200. Please reduce the lantus to 25 units daily, and:  Take 4 units of humalog, 3 times a day (just before each meal), no matter what the blood sugar is.  Please come back for a follow-up appointment in 2 months.

## 2018-09-28 ENCOUNTER — Ambulatory Visit: Payer: Medicare Other | Admitting: Orthotics

## 2018-09-28 ENCOUNTER — Ambulatory Visit (INDEPENDENT_AMBULATORY_CARE_PROVIDER_SITE_OTHER): Payer: Medicare Other | Admitting: Podiatry

## 2018-09-28 DIAGNOSIS — B351 Tinea unguium: Secondary | ICD-10-CM

## 2018-09-28 DIAGNOSIS — E1142 Type 2 diabetes mellitus with diabetic polyneuropathy: Secondary | ICD-10-CM

## 2018-09-28 DIAGNOSIS — M79676 Pain in unspecified toe(s): Secondary | ICD-10-CM

## 2018-09-28 DIAGNOSIS — L97511 Non-pressure chronic ulcer of other part of right foot limited to breakdown of skin: Secondary | ICD-10-CM

## 2018-09-28 NOTE — Progress Notes (Signed)

## 2018-09-28 NOTE — Progress Notes (Signed)
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  Today there was a small area of darkness that he is concerned about.  Denies fever chills nausea vomiting muscle aches and pain states that he continues to wrap the toe and dress it daily.  Objective: Vital signs are stable alert and oriented x3.  Pulses are palpable.  When I remove his sock today there was no dressing on the toe.  There is some blood on the sock.  There is a subcutaneous hematoma present that has tunneled from the primary lesion.  Once debrided demonstrates no erythema cellulitis drainage or odor no purulence no malodor it does not appear to be infected.  There is no erythema.  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  Plan: Redressed today with a Iodosorb dressing demonstrated this to him and expect him to dress this every day with a small amount of mupirocin which he already has at home and I dispensed a Darco shoe.  Follow-up with him in 2 weeks and he saw Liliane Channel today for diabetic shoes.

## 2018-10-03 ENCOUNTER — Encounter: Payer: Self-pay | Admitting: Cardiology

## 2018-10-03 ENCOUNTER — Ambulatory Visit: Payer: Medicare Other | Admitting: Endocrinology

## 2018-10-03 ENCOUNTER — Ambulatory Visit (INDEPENDENT_AMBULATORY_CARE_PROVIDER_SITE_OTHER): Payer: Medicare Other | Admitting: Cardiology

## 2018-10-03 VITALS — BP 118/64 | HR 50 | Ht 67.0 in | Wt 209.6 lb

## 2018-10-03 DIAGNOSIS — I480 Paroxysmal atrial fibrillation: Secondary | ICD-10-CM

## 2018-10-03 DIAGNOSIS — I5032 Chronic diastolic (congestive) heart failure: Secondary | ICD-10-CM | POA: Diagnosis not present

## 2018-10-03 DIAGNOSIS — I10 Essential (primary) hypertension: Secondary | ICD-10-CM | POA: Diagnosis not present

## 2018-10-03 DIAGNOSIS — J449 Chronic obstructive pulmonary disease, unspecified: Secondary | ICD-10-CM | POA: Diagnosis not present

## 2018-10-03 DIAGNOSIS — R600 Localized edema: Secondary | ICD-10-CM

## 2018-10-03 DIAGNOSIS — G4733 Obstructive sleep apnea (adult) (pediatric): Secondary | ICD-10-CM

## 2018-10-03 DIAGNOSIS — Z87898 Personal history of other specified conditions: Secondary | ICD-10-CM

## 2018-10-03 MED ORDER — FUROSEMIDE 20 MG PO TABS: ORAL_TABLET | ORAL | 3 refills | Status: DC

## 2018-10-03 MED ORDER — FUROSEMIDE 20 MG PO TABS: ORAL_TABLET | ORAL | 3 refills | Status: AC

## 2018-10-03 NOTE — Patient Instructions (Addendum)
Medication Instructions:  DECREASE METOPROLOL ( TOPROL ) 1/2 TABLET DAILY FOR 10 DAYS THEN STOP ALL TOGETHER  DECREASE TAKE 20 MG LASIX ( FUROSEMIDE)  DAILY , IF SWELLING OCCUR MAY TAKE AN ADDITIONAL 20 MG OF LASIX  If you need a refill on your cardiac medications before your next appointment, please call your pharmacy.   Lab work: TODAY  BMP BNP If you have labs (blood work) drawn today and your tests are completely normal, you will receive your results only by: Marland Kitchen MyChart Message (if you have MyChart) OR . A paper copy in the mail If you have any lab test that is abnormal or we need to change your treatment, we will call you to review the results.  Testing/Procedures: SCHEDULE AT [place] Your physician has requested that you have a lower extremity venous duplex. This test is an ultrasound of the veins in the legs. It looks at venous blood flow that carries blood from the heart to the legs. Allow one hour for a Lower Venous exam. There are no restrictions or special instructions. AND  Your physician has requested that you have an ankle brachial index (ABI). During this test an ultrasound and blood pressure cuff are used to evaluate the arteries that supply the arms and legs with blood. Allow thirty minutes for this exam. There are no restrictions or special instructions.     Follow-Up: At [facility], you and your health needs are our priority.  As part of our continuing mission to provide you with exceptional heart care, we have created designated Provider Care Teams.  These Care Teams include your primary Cardiologist (physician) and Advanced Practice Providers (APPs -  Physician Assistants and Nurse Practitioners) who all work together to provide you with the care you need, when you need it. You will need a follow up appointment in 4 MONTHS .  Please call our office 2 months in advance to schedule this appointment.  You may see Glenetta Hew, MDor one of the  following Advanced Practice Providers on your designated Care Team:   Rosaria Ferries, PA-C Jory Sims, DNP, ANP     You have been referred to [facility]- FOR COPD/OSA  Any Other Special Instructions Will Be Listed Below (If Applicable).  LOOK ONLINE FOR EXERCISE FOR LUMBAR AREA FOR BACK AND SCIATIC

## 2018-10-03 NOTE — Progress Notes (Signed)
PCP: Jeffery Lowenstein, FNP  Former Cardiologist:  Jeffery Shorten, MD  Cardiologist in [place], [place]  Address: [address], [place] [address]  Phone: [phone]  [facility] in [place], [place]  Address: [address] [place] [address]   Clinic Note: Chief Complaint  Patient presents with  . Hospitalization Follow-up    ER visit with confusion, possible UTI and told that he may have heart failure.    HPI: Jeffery [name] is a 78 y.o. male  With '[name]" & "LEG EDEMA"  Initially seen at the request of Jeffery [name], * to establish Cardiologist (had been followed by a Cardiologist in [place]. He now presents for ~1 month & Hospital f/u.  He was referred by physicians from [place] to establish cardiology care.  Was seen on September 29th. -->  He was noted to be extremely edematous with minimal help from HCTZ 12.5 mg.  Was started on Lasix 20 mg twice daily --> and then changed to alternating 40 mg and 20 mg every other day.  Was also encouraged to wear TED hose and foot elevation with his history of venous insufficiency and DVTs.  Noted concerns with wife managing his medications as opposed to outside nursing -adamantly against assisted living care or outside assistance. --Noticed low serum protein levels.  Was also noted to be hypertensive.  Called 25 mg twice daily metoprolol.  Jeffery [name] was seen back on December 16th 2019 as an initial cardiology visit.  At that time I really had no records and was not sure of anything besides what date it was passed on by his wife.  What he had mostly noted was lower extremity edema for which he takes Lasix.  I did find out the name of his cardiologist and was able to sign off for records that did arrive in time for this visit.  Reviewed below. --He was first seen in September 2016 for cardiology valuation and had an echocardiogram and stress test  that were done.  He then returned after being admitted in December 2018 for syncope that apparently occurred walking to the bathroom.  There was suspicion of possible orthostatic hypotension or bradycardia.  This was evaluated with both a cardiac event monitor and ambulatory blood pressure monitor also reviewed below.  No clear-cut etiology was found.  He actually also had carotid Dopplers done that were relatively normal as well. ---> No wearing his's notes did his prior cardiologist indicate anything about atrial fibrillation.  He also did not mention a diagnosis of heart failure.  Recent Hospitalizations:   ER visit September 24, 2018.  He came in with confusion/altered mental status.  This cleared up during the course of his ER visit.  They were concerned he may have some heart failure symptoms, but the patient and wife declined admission.  There is question of how much she was complying with his daily Lasix regimen of 20 mg and 40 mg alternating doses.  He was given 1 dose of IV Lasix and recommended take 40 mg for the next 3 days.  Studies Personally Reviewed - (if available, images/films reviewed: From Epic Chart or Care Everywhere) -- Reports from [facility], [place]) -> scanned into Epic  Myoview s   January 2019: -EF 65%.  No ischemia or infarction.  LOW RISK.  Mild inferior defect, likely diaphragmatic attenuation  September 2016: Normal LV function.  No ischemia or infarction.  Echocardiograms:  October 18, 2017 -[facility]: Mild LVH.  EF 55-60%.  GR 1 DD.  Trace mitral and tricuspid regurgitation.  Normal RA pressures.  September 2016-EF 55 to 60%.  GR 1 DD.  Severe LA dilation.  Aortic sclerosis but no stenosis.  Event Monitor (~08/2017): Sinus rhythm with sinus bradycardia, rate lowest 46 bpm.  Minimal PVCs.  Multiple PACs.  No atrial fibrillation.  No arrhythmias.  No indication for pacemaker.  Ambulatory BP monitor 01/2018: Average blood  pressure 147/71 mmHg.  Max 180/86 mmHg.  Minimum 106/50 mmHg.  85% of the time, elevated readings noted.  At that time losartan was increased to 100 mg.  Interval History: Jeffery [name] presents here for ~1 month & Hospital f/u.  He has he says he is feeling pretty well today.  When he went to the emergency room, he did not seem to be overly volume overloaded based on his wife's impression.  But of course they defer to what was told to them in the ER.  He had a BNP that was mildly elevated and so he therefore took extra Lasix for couple days and then quickly back down to the alternating doses that I had recommended. He really denies any PND, and has a stable roughly 2 pillow orthopnea.  He notes that his edema seems to be pretty well controlled. He is not sleeping all that well, and is still waiting to be referred for COPD and OSA.  He denies any rapid irregular heartbeats, but does occasionally feel skipped beats.  Nothing to suggest any atrial fibrillation.  (On reviewing his prior cardiology notes, nothing it was mentioned about A. Fib). He really denies any chest tightness or pressure with rest or exertion.  He does not really exert himself very much at all, being somewhat immobilized because of his knee pain.  Simply moving around the house he will get short of breath, but denies any chest pain.  He does not have his Ace wraps on today he does have a little bit more swelling in the right leg than the left with no real complaints of pain, just notes that he his legs feel heavy at the end of the day.  He continues to have to use a walker but only was able to walk short distances.  Just walking into the office here today more him out because of knee pain.  It is more of the knee discomfort and hip discomfort that bothers him then dyspnea or chest pain.  He never once mentioned chest pain. Denies any bleeding with Xarelto.  He still has his leg aching, right more than left.  It does not sound like his  claudication because it happens at rest is much more than he does with exertion.  Legs feel heavy and tired.  When he does note though is he feels a little bit tired and fatigued more so than he used to feel.  He is worried that his heart rate may be too slow.  Again they brought up the point of him being told in the past that he may need a pacemaker.  (On chart review, this appears to have been during his admission just over a year ago up in [place] for syncope that sounded more like it was related to micturition syncope.  He has not had any more episodes of confusion like he had when he went to the emergency room.  He was reportedly treated for UTI and he denies any hematuria or dysuria.  He always has nocturia.  He denies any syncope or near syncope.  No more confusion.   ROS: A comprehensive was performed. Review of Systems  Constitutional: Positive for malaise/fatigue (Generalized lack of energy). Negative for chills, fever and weight loss.  HENT: Negative for congestion and nosebleeds.   Respiratory: Positive for shortness of breath (At baseline) and wheezing (Off-and-on depending on the weather). Negative for cough (Has a mild nagging cough) and sputum production.   Cardiovascular: Positive for leg swelling (Pretty much at baseline.). Negative for claudication (legs hurt - but doesn't really walk enough).  Gastrointestinal: Positive for constipation (Has constant problems with this.  Primary physician recently added senna), heartburn and nausea. Negative for abdominal pain.  Genitourinary: Positive for frequency.  Musculoskeletal: Positive for back pain (He says he has some kind of mass on the left upper hip into the back.  This is extremely painful.  He takes narcotics for this.) and joint pain (He has bilateral hip but more left than right pain.  Bilateral knee pain).  Neurological: Positive for dizziness (Positional.) and weakness (His legs from hips down are generally weak).       Still  has some confusion, but according to his wife this is much clearer than he was when he went to hospital; he complains of intermittent radicular pain down R leg  Psychiatric/Behavioral: Positive for memory loss.  All other systems reviewed and are negative.   PAD Screen 09/04/2018  Previous PAD dx? Yes  Previous surgical procedure? Yes  Pain with walking? Yes  Subsides with rest? Yes  Feet/toe relief with dangling? Yes  Painful, non-healing ulcers? No  Extremities discolored? No    I have reviewed and (if needed) personally updated the patient's problem list, medications, allergies, past medical and surgical history, social and family history.   Past Medical History:  Diagnosis Date  . Angio-edema   . BPH (benign prostatic hyperplasia)   . Cognitive deficits   . COPD (chronic obstructive pulmonary disease) ([place])   . Degeneration of lumbar intervertebral disc   . Diabetes mellitus with neuropathy ([place])   . DVT of axillary vein, chronic, left ([place]) 2017   DVT related to PICC line -PICC line was then placed for long-term IV antibiotics in the setting of osteomyelitis.  Marland Kitchen History of syncope    Hospitalized December 2018. --> EVENT MONITOR: Sinus rhythm with sinus bradycardia, rate lowest 46 bpm.  Minimal PVCs.  Multiple PACs.  No atrial fibrillation.  No arrhythmias.  No indication for pacemaker..  Ambulatory BP recording also done - see HTN  . Hypertension    LABILE: Ambulatory BP monitor 01/2018: Average blood pressure 147/71 mmHg.  Max 180/86 mmHg.  Minimum 106/50 mmHg.  85% of the time, elevated readings noted.  At that time losartan was increased to 100 mg.  . Immunodeficiency syndrome ([place])   . Kidney disease   . Metabolic syndrome    Diabetes, hypertension, obesity  . Obesity (BMI 30.0-34.9)    BMI of 32.8  . Osteoarthritis   . Osteomyelitis ([place]) 2017   Toe  . Paroxysmal atrial fibrillation --by report, NOT confirmed    This was per patient report, but reviewing his prior  cardiologist's notes did not comment anything about atrial fibrillation.  . Pituitary neoplasm   . S/P splenectomy   . 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 Sleep apnea    On home oxygen, but not CPAP.  Is pending pulmonary medicine referral  . Venous insufficiency (chronic) (peripheral)  Past Surgical History:  Procedure Laterality Date  . APPENDECTOMY    . GALLBLADDER SURGERY    . HERNIA REPAIR    . LUMBAR DISC SURGERY    . NM MYOVIEW LTD  09/2017   [facility], [place]) -> scanned into Epic: a) September 2016: Normal LV function.  No ischemia or infarction.;; b) January 2019: -EF 65%.  No ischemia or infarction.  LOW RISK.  Mild inferior defect, likely diaphragmatic attenuation  . PAIN PUMP IMPLANTATION    . SPLENECTOMY, TOTAL    . TEMPORAL ARTERY BIOPSY / LIGATION    . TONSILLECTOMY    . TRANSTHORACIC ECHOCARDIOGRAM  09/2017   [facility], [place]) -> scanned into Epic:  a) 05/2015: EF 55 to 60%.  GR 1 DD.  Severe LA dilation.  Aortic sclerosis but no stenosis.;; b) Mild LVH.  EF 55-60%.  GR 1 DD.  Trace mitral and tricuspid regurgitation.  Normal RA pressures.    Current Meds  Medication Sig  . allopurinol (ZYLOPRIM) 300 MG tablet Take 300 mg by mouth daily.   Marland Kitchen amLODipine (NORVASC) 10 MG tablet Take 10 mg by mouth daily.   . calcitRIOL (ROCALTROL) 0.25 MCG capsule Take 0.25 mcg by mouth daily.   Marland Kitchen donepezil (ARICEPT) 10 MG tablet Take 10 mg by mouth at bedtime.   . finasteride (PROSCAR) 5 MG tablet Take 5 mg by mouth daily.   . Fluticasone-Umeclidin-Vilant (TRELEGY ELLIPTA IN) Inhale 2 puffs into the lungs daily.   . furosemide (LASIX) 20 MG tablet TAKE 20 MG TABLET EVERY DAY , MAY TAKE AN EXTRA 20 MG FOR INCREASE  SWELLING  . gabapentin (NEURONTIN) 300 MG capsule Take 300 mg by mouth 3 (three) times daily as needed.   Marland Kitchen GAMMAGARD 20 GM/200ML SOLN every 30 (thirty) days.   . Insulin Glargine (LANTUS SOLOSTAR)  100 UNIT/ML Solostar Pen Inject 25 Units into the skin every morning.  . insulin lispro (HUMALOG KWIKPEN) 100 UNIT/ML KwikPen Inject 0.04 mLs (4 Units total) into the skin 3 (three) times daily with meals. And pen needles 4/day  . losartan (COZAAR) 100 MG tablet Take 100 mg by mouth daily.   . montelukast (SINGULAIR) 10 MG tablet Take 10 mg by mouth at bedtime.  Marland Kitchen omeprazole (PRILOSEC) 10 MG capsule Take by mouth daily.   Marland Kitchen oxyCODONE (ROXICODONE) 15 MG immediate release tablet Take 15 mg by mouth every 6 (six) hours as needed for pain.  . OXYCONTIN 15 MG 12 hr tablet Take 15 mg by mouth every 12 (twelve) hours.   . tamsulosin (FLOMAX) 0.4 MG CAPS capsule Take 0.4 mg by mouth daily.   Alveda Reasons 15 MG TABS tablet Take 15 mg by mouth daily with supper.   . [DISCONTINUED] furosemide (LASIX) 20 MG tablet Alternate taking 20 mg daily and 40 mg daily  . [DISCONTINUED] furosemide (LASIX) 20 MG tablet TAKE 20 MG TABLET EVERY DAY , MAY TAKE AN EXTRA 20 MG FOR INCREASE  SWELLING  . [DISCONTINUED] metoprolol succinate (TOPROL-XL) 25 MG 24 hr tablet Take 25 mg by mouth daily.     Allergies  Allergen Reactions  . Betadine [Povidone Iodine] Rash  . Keflex [Cephalexin] Rash  . Penicillins Rash    Social History   Tobacco Use  . Smoking status: Former Smoker    Last attempt to quit: 06/07/1988    Years since quitting: 30.3  . Smokeless tobacco: Never Used  Substance Use Topics  . Alcohol use:  Never    Frequency: Never  . Drug use: Never   Social History   Social History Narrative   He and his wife moved to [place] roughly 2 years ago to be close to their children.  They have 4 adult children that live in [place], so they moved to be closer to the family.   Jeffery [name] is not very active mostly related to arthritis and low back pain.   They currently reside in an independent living facility.    family history includes Diabetes in his brother.  Wt Readings from Last 3 Encounters:  10/03/18 209 lb  9.6 oz (95.1 kg)  09/27/18 203 lb (92.1 kg)  09/25/18 198 lb (89.8 kg)    PHYSICAL EXAM BP 118/64   Pulse (!) 50   Ht 5\' 7"  (1.702 m)   Wt 209 lb 9.6 oz (95.1 kg)   SpO2 95%   BMI 32.83 kg/m  Physical Exam  Constitutional: He is oriented to person, place, and time. He appears well-developed. No distress.  Obese (mostly truncal) with chronic ill appearance.  Sitting in the chair.  Mild discomfort appearance, but appears overall better than he did last visit.  HENT:  Head: Normocephalic and atraumatic.  Mouth/Throat: Oropharynx is clear and moist.  Neck: Normal range of motion. Neck supple. No hepatojugular reflux (Unable to assess due to body habitus) and no JVD present. Carotid bruit is not present. No tracheal deviation present.  Cardiovascular: Regular rhythm, S1 normal and S2 normal.  Occasional extrasystoles are present. Bradycardia present. PMI is not displaced (Unable to palpate). Exam reveals distant heart sounds and decreased pulses (Difficult to palpate pedal pulses due to edema and body habitus). Exam reveals no gallop and no friction rub.  No murmur heard. Pulmonary/Chest: Effort normal. No respiratory distress. He has wheezes (With forced expiration).  Distant breath sounds.  No obvious basal rales.  Mild interstitial sounds, worse in the base  Abdominal: Soft. Bowel sounds are normal. He exhibits no distension. There is no abdominal tenderness. There is no rebound.  Obese.  Unable to assess HSM  Musculoskeletal: Normal range of motion.        General: Edema (2-3+ bilateral) present.  Neurological: He is alert and oriented to person, place, and time. No cranial nerve deficit.  Skin: Skin is warm and dry.  Bilateral venous stasis changes with no obvious wounds.  Not currently wearing his Ace wraps.  Psychiatric:  Relatively slow to respond to questions, poor memory. Relatively poor historian. His wife answers most questions.   Vitals reviewed.    Adult ECG Report Not  checked.   Other studies Reviewed: Additional studies/ records that were reviewed today include:  Recent Labs:  None evailable--BNP and BMP checked today --Creatinine on January 5 in the hospital was 2.42 with potassium of 4.6.  ASSESSMENT / PLAN: Problem List Items Addressed This Visit    Bilateral lower extremity edema - Primary (Chronic)    As mentioned, I think this is probably mostly venous stasis related.  We will check bilateral venous Dopplers.  Also with him having leg aching, will check lower extremity arterial Dopplers/ABIs just to ensure that he does not have any PAD.  Continue to use Ace wraps or compression stockings.  Elevate feet when possible.  I think he may benefit from the foot pillow --> we will provide him pressure next visit, if still available..      Relevant Orders   Brain natriuretic peptide (Completed)   Basic metabolic panel (  Completed)   VAS [place] ABI WITH/WO TBI   VAS [place] LOWER EXTREMITY VENOUS REFLUX   Chronic diastolic (congestive) heart failure (HCC) (Chronic)    Pseudo-diagnosis with 2 echocardiograms only showing grade 1 diastolic dysfunction.  It is hard to call what he has diastolic heart failure.  I think his edema is related to chronic venous insufficiency.  I do not think, even though his BNP level was mildly elevated that he truly had heart failure.  I I will have him reduce back down to 20 mg of Lasix, but I will check a BNP and BMP because of his recent titration of dosing.  I am concerned that he may have some renal insufficiency or hypokalemia.  He is on ARB which we may need to stop if his renal function is worse. He is on amlodipine which could be contributing to edema, but with the intention of stopping beta-blocker because of bradycardia we will keep it on for now.        Relevant Medications   furosemide (LASIX) 20 MG tablet   Other Relevant Orders   Brain natriuretic peptide (Completed)   Basic metabolic panel (Completed)   VAS [place] ABI  WITH/WO TBI   VAS [place] LOWER EXTREMITY VENOUS REFLUX   Chronic obstructive pulmonary disease (Chronic)    Apparently there is probably the process of him being referred for pulmonary medicine.  I would like to go ahead and resubmit a referral to pulmonary medicine for both COPD and OSA.  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 Continue Singulair and Trelegy.      Relevant Orders   Ambulatory referral to Pulmonology   Brain natriuretic peptide (Completed)   Basic metabolic panel (Completed)   Essential hypertension (Chronic)    Somewhat labile blood pressures, I wonder if his syncopal episode back in December was related to neurocardiogenic syncope with drops of blood pressure having standing up going to the bathroom.  This sounds relatively realistic given his labile pressures on ambulatory blood pressure monitoring.  Once we have to get him somewhat established, I would like to discuss the possibility of having home health monitoring system set up with [employer]      Relevant Medications   furosemide (LASIX) 20 MG tablet   Other Relevant Orders   Brain natriuretic peptide (Completed)   Basic metabolic panel (Completed)   History of syncope    Syncopal episode a little over a year ago that sounded like it was probably potentially neurocardiogenic in nature.  Need to monitor.  He had an ambulatory blood pressure monitor which did not show any significant hypotension, however he tells me that he has had several readings in the 19F systolic. With a resting heart rate of 50, and will stop his beta-blocker.--Wean off.      OSA (obstructive sleep apnea) (Chronic)    Referred to pulmonary medicine to help manage both OSA and COPD.      Relevant Orders   Ambulatory referral to Pulmonology   Brain natriuretic peptide (Completed)   Basic metabolic panel (Completed)   PAF (paroxysmal atrial fibrillation) (HCC) (Chronic)    This IS NOT  CONFIRMED.  His prior cardiologist never mentions A. Fib.  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  With baseline sinus bradycardia, I am in a stop his beta-blocker, I will been to have him start weaning it off.      Relevant Medications   furosemide (LASIX)  20 MG tablet   Other Relevant Orders   Brain natriuretic peptide (Completed)   Basic metabolic panel (Completed)      I spent a total of 35 minutes with the patient and chart review for this visit including his recent hospitalization and current symptoms, labs.. >  50% of the time was spent in direct patient consultation.   An additional 45 to 50 minutes was spent in reviewing old records that arrived on the day of this visit.  Several old clinic notes as well as reports of echocardiograms, myoviews, ambulatory event monitor and amatory blood pressure recordings.  Results were reviewed, compared, history was reviewed and electronic record was updated.   Current medicines are reviewed at length with the patient today.  (+/- concerns) was told to take Lasix 40 daily.  He did that for couple days, but then went to 40-20 alternating as previously directed The following changes have been made:  see below.  Patient Instructions  Medication Instructions:  DECREASE METOPROLOL ( TOPROL ) 1/2 TABLET DAILY FOR 10 DAYS THEN STOP ALL TOGETHER  DECREASE TAKE 20 MG LASIX ( FUROSEMIDE)  DAILY , IF SWELLING OCCUR MAY TAKE AN ADDITIONAL 20 MG OF LASIX  If you need a refill on your cardiac medications before your next appointment, please call your pharmacy.   Lab work: TODAY  BMP BNP If you have labs (blood work) drawn today and your tests are completely normal, you will receive your results only by: Marland Kitchen MyChart Message (if you have MyChart) OR . A paper copy in the mail If you have any lab test that is abnormal or we need to change your treatment, we will call you to review the results.  Testing/Procedures: SCHEDULE  AT [facility] Your physician has requested that you have a lower extremity venous duplex. This test is an ultrasound of the veins in the legs. It looks at venous blood flow that carries blood from the heart to the legs. Allow one hour for a Lower Venous exam. There are no restrictions or special instructions. AND  Your physician has requested that you have an ankle brachial index (ABI). During this test an ultrasound and blood pressure cuff are used to evaluate the arteries that supply the arms and legs with blood. Allow thirty minutes for this exam. There are no restrictions or special instructions.     Follow-Up: At [facility], you and your health needs are our priority.  As part of our continuing mission to provide you with exceptional heart care, we have created designated Provider Care Teams.  These Care Teams include your primary Cardiologist (physician) and Advanced Practice Providers (APPs -  Physician Assistants and Nurse Practitioners) who all work together to provide you with the care you need, when you need it. You will need a follow up appointment in 4 MONTHS .  Please call our office 2 months in advance to schedule this appointment.  You may see Glenetta Hew, MDor one of the following Advanced Practice Providers on your designated Care Team:   Rosaria Ferries, PA-C Jory Sims, DNP, ANP     You have been referred to [place]- FOR COPD/OSA  Any Other Special Instructions Will Be Listed Below (If Applicable).  LOOK ONLINE FOR EXERCISE FOR LUMBAR AREA FOR BACK AND SCIATIC    Studies Ordered:   Orders Placed This Encounter  Procedures  . Brain natriuretic peptide  . Basic metabolic panel  . Ambulatory referral to Pulmonology  Glenetta Hew, M.D., M.S. Interventional Cardiologist   Pager # [phone] Phone # [phone] [address] [place], [place] [address]   Thank you for choosing Heartcare at  [facility]!!

## 2018-10-04 ENCOUNTER — Encounter: Payer: Self-pay | Admitting: Cardiology

## 2018-10-04 DIAGNOSIS — Z87898 Personal history of other specified conditions: Secondary | ICD-10-CM | POA: Insufficient documentation

## 2018-10-04 LAB — BASIC METABOLIC PANEL
BUN/Creatinine Ratio: 17 (ref 10–24)
BUN: 48 mg/dL — ABNORMAL HIGH (ref 8–27)
CO2: 20 mmol/L (ref 20–29)
Calcium: 9.2 mg/dL (ref 8.6–10.2)
Chloride: 101 mmol/L (ref 96–106)
Creatinine, Ser: 2.78 mg/dL — ABNORMAL HIGH (ref 0.76–1.27)
GFR calc Af Amer: 24 mL/min/{1.73_m2} — ABNORMAL LOW (ref >59)
GFR calc non Af Amer: 21 mL/min/{1.73_m2} — ABNORMAL LOW (ref >59)
Glucose: 143 mg/dL — ABNORMAL HIGH (ref 65–99)
Potassium: 5 mmol/L (ref 3.5–5.2)
Sodium: 136 mmol/L (ref 134–144)

## 2018-10-04 LAB — BRAIN NATRIURETIC PEPTIDE: BNP: 256.6 pg/mL — ABNORMAL HIGH (ref 0.0–100.0)

## 2018-10-04 NOTE — Assessment & Plan Note (Signed)
As mentioned, I think this is probably mostly venous stasis related.  We will check bilateral venous Dopplers.  Also with him having leg aching, will check lower extremity arterial Dopplers/ABIs just to ensure that he does not have any PAD.  Continue to use Ace wraps or compression stockings.  Elevate feet when possible.  I think he may benefit from the foot pillow --> we will provide him pressure next visit, if still available.Marland Kitchen

## 2018-10-04 NOTE — Assessment & Plan Note (Signed)
Referred to pulmonary medicine to help manage both OSA and COPD.

## 2018-10-04 NOTE — Assessment & Plan Note (Signed)
Somewhat labile blood pressures, I wonder if his syncopal episode back in December was related to neurocardiogenic syncope with drops of blood pressure having standing up going to the bathroom.  This sounds relatively realistic given his labile pressures on ambulatory blood pressure monitoring.  Once we have to get him somewhat established, I would like to discuss the possibility of having home health monitoring system set up with [employer]

## 2018-10-04 NOTE — Assessment & Plan Note (Addendum)
Apparently there is probably the process of him being referred for pulmonary medicine.  I would like to go ahead and resubmit a referral to pulmonary medicine for both COPD and OSA.  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 Continue Singulair and Trelegy.

## 2018-10-04 NOTE — Assessment & Plan Note (Signed)
Pseudo-diagnosis with 2 echocardiograms only showing grade 1 diastolic dysfunction.  It is hard to call what he has diastolic heart failure.  I think his edema is related to chronic venous insufficiency.  I do not think, even though his BNP level was mildly elevated that he truly had heart failure.  I I will have him reduce back down to 20 mg of Lasix, but I will check a BNP and BMP because of his recent titration of dosing.  I am concerned that he may have some renal insufficiency or hypokalemia.  He is on ARB which we may need to stop if his renal function is worse. He is on amlodipine which could be contributing to edema, but with the intention of stopping beta-blocker because of bradycardia we will keep it on for now.

## 2018-10-04 NOTE — Assessment & Plan Note (Signed)
Syncopal episode a little over a year ago that sounded like it was probably potentially neurocardiogenic in nature.  Need to monitor.  He had an ambulatory blood pressure monitor which did not show any significant hypotension, however he tells me that he has had several readings in the 00X systolic. With a resting heart rate of 50, and will stop his beta-blocker.--Wean off.

## 2018-10-04 NOTE — Assessment & Plan Note (Signed)
This IS NOT CONFIRMED.  His prior cardiologist never mentions A. Fib.  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  With baseline sinus bradycardia, I am in a stop his beta-blocker, I will been to have him start weaning it off.

## 2018-10-05 ENCOUNTER — Telehealth: Payer: Self-pay | Admitting: *Deleted

## 2018-10-05 DIAGNOSIS — R799 Abnormal finding of blood chemistry, unspecified: Secondary | ICD-10-CM

## 2018-10-05 DIAGNOSIS — Z79899 Other long term (current) drug therapy: Secondary | ICD-10-CM

## 2018-10-05 DIAGNOSIS — N289 Disorder of kidney and ureter, unspecified: Secondary | ICD-10-CM

## 2018-10-05 DIAGNOSIS — R7989 Other specified abnormal findings of blood chemistry: Secondary | ICD-10-CM

## 2018-10-05 NOTE — Telephone Encounter (Signed)
The patient and wife has been notified of the result and verbalized understanding.  All questions (if any) were answered. Raiford Simmonds, RN 10/05/2018 2:03 PM  Aware to come to office on Monday or Tuesday for labs.  wife states they will be here on Monday 10/09/18 due to transportation. RN gave instructions of time when lab was open  verbalized understanding.   order placed.

## 2018-10-05 NOTE — Telephone Encounter (Signed)
-----   Message from Leonie Man, MD sent at 10/04/2018 12:49 PM EST ----- Kidney function looks like it got a little worse.  I would recommend holding Lasix for 2 days, increase hydration/water intake to 10 glasses of water a day. Recheck chemistry Monday or Tuesday of next week  Glenetta Hew, MD

## 2018-10-10 ENCOUNTER — Telehealth: Payer: Self-pay | Admitting: Cardiology

## 2018-10-10 LAB — BASIC METABOLIC PANEL
BUN/Creatinine Ratio: 21 (ref 10–24)
BUN: 46 mg/dL — ABNORMAL HIGH (ref 8–27)
CO2: 19 mmol/L — ABNORMAL LOW (ref 20–29)
Calcium: 9.1 mg/dL (ref 8.6–10.2)
Chloride: 102 mmol/L (ref 96–106)
Creatinine, Ser: 2.14 mg/dL — ABNORMAL HIGH (ref 0.76–1.27)
GFR calc Af Amer: 33 mL/min/{1.73_m2} — ABNORMAL LOW (ref >59)
GFR calc non Af Amer: 29 mL/min/{1.73_m2} — ABNORMAL LOW (ref >59)
GLUCOSE: 167 mg/dL — AB (ref 65–99)
Potassium: 4.9 mmol/L (ref 3.5–5.2)
Sodium: 142 mmol/L (ref 134–144)

## 2018-10-10 NOTE — Telephone Encounter (Signed)
Spoke with pt wife. Confirmed Dr.Hardings recommendation given on 10/03/18 regarding Metoprolol. DECREASE METOPROLOL ( TOPROL ) 1/2 TABLET DAILY FOR 10 DAYS THEN STOP ALL TOGETHER.  Pt wife is aware of lab results and Dr.Hardings recommendation. Notes recorded by Leonie Man, MD on 10/10/2018 at 7:24 AM EST Good news!. Kidney function has stabilized - heading back towards baseline. Still look a bit dehydrated though. Would hold a day of Lasix & stay with 20 mg daily for the rest of this week before going back to alternating 40 mg& 20mg .

## 2018-10-10 NOTE — Telephone Encounter (Signed)
New Message   Pt c/o medication issue:  1. Name of Medication: metoprolol succinate (TOPROL-XL) 25 MG 24 hr tablet  2. How are you currently taking this medication (dosage and times per day)?   3. Are you having a reaction (difficulty breathing--STAT)?   4. What is your medication issue? Patient wife is calling because he was advised to stop taking the meroprolol but she can not remember if it was for 10 or 20 days. Please call to advise.

## 2018-10-16 ENCOUNTER — Ambulatory Visit (HOSPITAL_COMMUNITY)
Admission: RE | Admit: 2018-10-16 | Payer: Medicare Other | Source: Ambulatory Visit | Attending: Cardiology | Admitting: Cardiology

## 2018-10-16 ENCOUNTER — Ambulatory Visit (HOSPITAL_COMMUNITY): Payer: Medicare Other

## 2018-10-17 ENCOUNTER — Other Ambulatory Visit: Payer: Self-pay | Admitting: Podiatry

## 2018-10-18 ENCOUNTER — Other Ambulatory Visit: Payer: Self-pay | Admitting: Emergency Medicine

## 2018-10-18 DIAGNOSIS — R1084 Generalized abdominal pain: Secondary | ICD-10-CM

## 2018-10-18 NOTE — Telephone Encounter (Signed)
Requested medication (s) are due for refill today -yes- if patient to continue Rx  Requested medication (s) are on the active medication list -no  Future visit scheduled -no  Last refill: 08/10/18  Notes to clinic: Patient is calling for refill of medication written 08/10/18- not given extended RF's. Sent for provider review.  Requested Prescriptions  Pending Prescriptions Disp Refills   dicyclomine (BENTYL) 20 MG tablet [Pharmacy Med Name: DICYCLOMINE HCL 20 MG TAB] 30 tablet 1    Sig: TAKE ONE TABLET THREE TIMES DAILY AS NEEDED FOR SPASM     Gastroenterology:  Antispasmodic Agents Passed - 10/18/2018 11:29 AM      Passed - Last Heart Rate in normal range    Pulse Readings from Last 1 Encounters:  10/03/18 (!) 50         Passed - Valid encounter within last 12 months    Recent Outpatient Visits          2 months ago Generalized abdominal pain   [facility], Ines Bloomer, MD      Future Appointments            In 1 week Ernst Bowler Gwenith Daily, MD [facility]            Requested Prescriptions  Pending Prescriptions Disp Refills   dicyclomine (BENTYL) 20 MG tablet [Pharmacy Med Name: DICYCLOMINE HCL 20 MG TAB] 30 tablet 1    Sig: TAKE ONE TABLET THREE TIMES DAILY AS NEEDED FOR SPASM     Gastroenterology:  Antispasmodic Agents Passed - 10/18/2018 11:29 AM      Passed - Last Heart Rate in normal range    Pulse Readings from Last 1 Encounters:  10/03/18 (!) 50         Passed - Valid encounter within last 12 months    Recent Outpatient Visits          2 months ago Generalized abdominal pain   [facility], Ines Bloomer, MD      Future Appointments            In 1 week Ernst Bowler Gwenith Daily, MD [facility] [place]

## 2018-10-19 ENCOUNTER — Ambulatory Visit (INDEPENDENT_AMBULATORY_CARE_PROVIDER_SITE_OTHER): Payer: Medicare Other | Admitting: Podiatry

## 2018-10-19 DIAGNOSIS — E1142 Type 2 diabetes mellitus with diabetic polyneuropathy: Secondary | ICD-10-CM

## 2018-10-19 DIAGNOSIS — L97511 Non-pressure chronic ulcer of other part of right foot limited to breakdown of skin: Secondary | ICD-10-CM | POA: Diagnosis not present

## 2018-10-19 NOTE — Progress Notes (Signed)
He presents today for a wound check to the distal aspect of the hallux right.  He states that he has been wearing his Darco shoe and he is wearing his diabetic shoes.  He is concerned about how long he has to remain in the surgical shoe.  Objective: Vital signs are stable he is alert and oriented x3.  Pulses are palpable.  Ulcerative lesion to the hallux appears to be healing very nicely there is some reactive hyperkeratotic tissue was debrided reveals a much much smaller ulceration it is only a small fissure now measuring about 5 mm in length.  There is no purulence no malodor.  Assessment: Well-healing diabetic ulceration hallux right.  Plan: Redressed with Silvadene cream and dressed a compressive dressing today encouraged him to continue the use of the surgical shoe and to completely resolved and he has picked up his diabetic shoes I will follow-up with him in a couple of weeks just to reevaluate make sure he is doing well she have questions or concerns he will notify us immediately.

## 2018-10-23 ENCOUNTER — Institutional Professional Consult (permissible substitution): Payer: Medicare Other | Admitting: Pulmonary Disease

## 2018-10-23 ENCOUNTER — Ambulatory Visit (HOSPITAL_COMMUNITY)
Admission: RE | Admit: 2018-10-23 | Discharge: 2018-10-23 | Disposition: A | Discharge status: Home / Self Care | Payer: Medicare Other | Source: Ambulatory Visit | Attending: Allergy & Immunology | Admitting: Allergy & Immunology

## 2018-10-23 DIAGNOSIS — D803 Selective deficiency of immunoglobulin G [IgG] subclasses: Secondary | ICD-10-CM | POA: Diagnosis not present

## 2018-10-23 MED ORDER — DIPHENHYDRAMINE HCL 25 MG PO CAPS
25.0000 mg | ORAL_CAPSULE | ORAL | Status: DC
  Administered 2018-10-23: 25 mg via ORAL

## 2018-10-23 MED ORDER — IMMUNE GLOBULIN (HUMAN) 20 GM/200ML IV SOLN
30.0000 g | INTRAVENOUS | Status: DC
  Administered 2018-10-23: 30 g via INTRAVENOUS
  Filled 2018-10-23: qty 100

## 2018-10-23 MED ORDER — SODIUM CHLORIDE 0.9 % IV SOLN
Freq: Once | INTRAVENOUS | Status: AC
  Administered 2018-10-23: 09:00:00 via INTRAVENOUS

## 2018-10-23 MED ORDER — ACETAMINOPHEN 325 MG PO TABS
650.0000 mg | ORAL_TABLET | ORAL | Status: DC
  Administered 2018-10-23: 650 mg via ORAL

## 2018-10-23 MED ORDER — ACETAMINOPHEN 325 MG PO TABS
ORAL_TABLET | ORAL | Status: AC
  Filled 2018-10-23: qty 2

## 2018-10-23 MED ORDER — DIPHENHYDRAMINE HCL 25 MG PO CAPS
ORAL_CAPSULE | ORAL | Status: AC
  Filled 2018-10-23: qty 1

## 2018-10-25 ENCOUNTER — Institutional Professional Consult (permissible substitution): Payer: Medicare Other | Admitting: Pulmonary Disease

## 2018-10-26 ENCOUNTER — Encounter: Payer: Self-pay | Admitting: Allergy & Immunology

## 2018-10-26 ENCOUNTER — Ambulatory Visit (INDEPENDENT_AMBULATORY_CARE_PROVIDER_SITE_OTHER): Payer: Medicare Other | Admitting: Allergy & Immunology

## 2018-10-26 VITALS — BP 140/80 | HR 88 | Resp 20

## 2018-10-26 DIAGNOSIS — D803 Selective deficiency of immunoglobulin G [IgG] subclasses: Secondary | ICD-10-CM | POA: Diagnosis not present

## 2018-10-26 DIAGNOSIS — J449 Chronic obstructive pulmonary disease, unspecified: Secondary | ICD-10-CM

## 2018-10-26 NOTE — Progress Notes (Signed)
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 IgG1 subclass deficiency - We will get labs prior to the next infusion (IgG, CMP, CBC). - This can be done at the infusion center. - We will continue with the same dosing for now.  - We will continue with the normal saline bolus. - We will change from Benadryl to Zyrtec for your premedication before your infusions.   2. Chronic obstructive pulmonary disease - Lung function looks stable today. - We will continue with the Trelegy since this is working so well. - I will forward my note to Dr. Halford Chessman so that he is aware of your history.   3. Chronic rhinitis - Continue with Nasacort 1-2 sprays per nostril daily.  4. Return in about 6 months (around 04/26/2019).  Subjective:   Jeffery [name] is a 78 y.o. male presenting today for follow up of  Chief Complaint  Patient presents with  . Breathing Problem    Jeffery [name] has a history of the following: Patient Active Problem List   Diagnosis Date Noted  . History of syncope 10/04/2018  . Diabetes ([place]) 09/27/2018  . OSA (obstructive sleep apnea) 09/18/2018  . Essential hypertension 09/18/2018  . Bilateral lower extremity edema 09/18/2018  . Chronic diastolic (congestive) heart failure ([place]) 09/18/2018  . PAF (paroxysmal atrial fibrillation) ([place]) 09/18/2018  . Generalized abdominal pain 08/10/2018  . IgG1 subclass deficiency ([place]) 06/07/2018  . Chronic obstructive pulmonary disease 06/07/2018  . Chronic rhinitis 06/07/2018    History obtained from: chart review and patient.  Jeffery [name] Primary Care Provider is Sheets, Elmon Else, [place].     Cardiologist: Dr. Glenetta Hew    Endocrinologist: Dr. Renato Shin Pulmonologist: Dr. Chesley Mires  Jeffery [name] is a 78 y.o. male presenting for a follow up visit.  He was last seen in September 2019 as he establish care here.  He has a history of IgG 1 subclass deficiency and was on Gammagard 30 g monthly which equated to approximately 308 mg/kg/month.  His lung function looked terrible and we continued with Trelegy 1 puff once daily.  We did arrange for him to get evaluated by a pulmonologist and establish care here.  We obtain lab work to look for evidence of environmental allergies commended using Nasacort 1 to 2 sprays per nostril daily for his chronic rhinitis.  He was seen again by Dr. Verlin Fester on December 9 for a follow-up visit.  COPD was stable and he was continued on Trelegy.  He was also continued on Nasacort 1 to 2 sprays per nostril daily as needed and nasal saline rinses.  In the interim, he has had problems with headaches during his infusions.  In October 2019, we did add a 500 mL normal saline bolus prior to administration of the IVIG.  We did receive a call from the infusion center in early January.  We decided to hold his premedication with IV fluids since he was recently seen for a CHF exacerbation.   Since the last visit, he has done well from an infection standpoint. He has not needed any antibiotics since the last visit aside from a UTI in January 2019. Infusions are now fairly well tolerated at this point especially with the IVF. He  did not do fluids in the January infusion, but we did receive the fluids prior to his last infusion a few days ago. He has not needed any hospitalizations for infections since the last visit.   He continued with Rhinocort for his rhinitis. He seems to be using it on a daily basis with good control of his symptoms. He is also using the Trelegy one puff once daily. He has a rescue inhaler that he really does not use much at all. Spirometry is stable today. He did have some difficulty  breathing earlier this month, but this was felt to be more related to his CHF. He has not seen pulmonology yet but has an appointment with Dr. Halford Chessman in the next few days.   He sees Dr. Glenetta Hew in cardiology for bilateral lower extremity edema as well as his congestive heart failure and paroxysmal atrial fibrillation.  He sees Dr. Renato Shin for type 2 diabetes.  Otherwise, there have been no changes to his past medical history, surgical history, family history, or social history. They are considering moving to [place] to be closer to their daughter.     Review of Systems: a 14-point review of systems is pertinent for what is mentioned in HPI.  Otherwise, all other systems were negative.  Constitutional: negative other than that listed in the HPI Eyes: negative other than that listed in the HPI Ears, nose, mouth, throat, and face: negative other than that listed in the HPI Respiratory: negative other than that listed in the HPI Cardiovascular: negative other than that listed in the HPI Gastrointestinal: negative other than that listed in the HPI Genitourinary: negative other than that listed in the HPI Integument: negative other than that listed in the HPI Hematologic: negative other than that listed in the HPI Musculoskeletal: negative other than that listed in the HPI Neurological: negative other than that listed in the HPI Allergy/Immunologic: negative other than that listed in the HPI    Objective:   Blood pressure 140/80, pulse 88, resp. rate 20, SpO2 95 %. There is no height or weight on file to calculate BMI.   Physical Exam:  General: Alert, interactive, in no acute distress. Talkative. Well appearing for him. Eyes: No conjunctival injection bilaterally, no discharge on the right, no discharge on the left and no Horner-Trantas dots present. PERRL bilaterally. EOMI without pain. No photophobia.  Ears: Right TM pearly gray with normal light reflex, Left TM pearly  gray with normal light reflex, Right TM intact without perforation and Left TM intact without perforation.  Nose/Throat: External nose within normal limits and septum midline. Turbinates edematous with clear discharge. Posterior oropharynx erythematous without cobblestoning in the posterior oropharynx. Tonsils 2+ without exudates.  Tongue without thrush. Lungs: Course breath sounds without wheezing or increased work of breathing. He does have decreased air movement in the bases. No increased work of breathing. CV: Normal S1/S2. No murmurs. Capillary refill <2 seconds. Bilateral LE swelling, especially on the right side. Skin: Warm and dry, without lesions or rashes. Neuro:   Grossly intact. No focal deficits appreciated. Responsive to questions.  Diagnostic studies:   Spirometry: results abnormal (FEV1: 1.16/45%, FVC: 2.26/62%, FEV1/FVC: 51%).    Spirometry consistent with normal pattern.   Allergy Studies: none      Salvatore Marvel, MD  [facility]

## 2018-10-26 NOTE — Patient Instructions (Addendum)
1. IgG1 subclass deficiency - We will get an IgG level prior to the next infusion. - This can be done at the infusion center. - We will continue with the same dosing for now.  - We will continue with the normal saline bolus. - We will change from Benadryl to Zyrtec for your premedication before your infusions.   2. Chronic obstructive pulmonary disease - Lung function looks stable today. - We will continue with the Trelegy since this is working so well. - I will forward my note to Dr. Halford Chessman so that he is aware of your history.   3. Chronic rhinitis - Continue with Nasacort 1-2 sprays per nostril daily.  4. Return in about 6 months (around 04/26/2019).   Please inform us of any Emergency Department visits, hospitalizations, or changes in symptoms. Call us before going to the ED for breathing or allergy symptoms since we might be able to fit you in for a sick visit. Feel free to contact us anytime with any questions, problems, or concerns.  It was a pleasure to see you and your family again today!  Websites that have reliable patient information: 1. American Academy of Asthma, Allergy, and Immunology: www.aaaai.org 2. Food Allergy Research and Education (FARE): foodallergy.org 3. Mothers of Asthmatics: http://www.asthmacommunitynetwork.org 4. American College of Allergy, Asthma, and Immunology: MonthlyElectricBill.co.uk   Make sure you are registered to vote! If you have moved or changed any of your contact information, you will need to get this updated before voting!    Voter ID laws are POSSIBLY going into effect for the General Election in November 2020! Be prepared! Check out http://levine.[email or web address] for more details.

## 2018-10-27 ENCOUNTER — Telehealth: Payer: Self-pay | Admitting: *Deleted

## 2018-10-27 NOTE — Telephone Encounter (Signed)
-----   Message from Valentina Shaggy, MD sent at 10/27/2018  6:12 AM EST ----- Can we change his premedication antihistamine to cetirizine instead of benadryl? A tablet should be fine instead of IV. The Benadryl is making him too loopy. Also I put in labs to be drawn prior to the next infusion. Can we make a note for the infusion center? I am not sure how to do that. Thanks!   Fara Olden

## 2018-10-27 NOTE — Telephone Encounter (Signed)
Noted.  Thanks so much for your help Tammy!  Salvatore Marvel, MD [facility]

## 2018-10-27 NOTE — Telephone Encounter (Signed)
I have faxed new edited order to short stay and I will follow up and make sure they make change eff. 11/20/18 infusion. If there are any issues I will let you and patient know.  If they are unable to premedicate with ceterizine he will have to do so himself at home.

## 2018-10-30 ENCOUNTER — Ambulatory Visit (HOSPITAL_COMMUNITY)
Admission: RE | Admit: 2018-10-30 | Discharge: 2018-10-30 | Disposition: A | Discharge status: Home / Self Care | Payer: Medicare Other | Source: Ambulatory Visit | Attending: Internal Medicine | Admitting: Internal Medicine

## 2018-10-30 ENCOUNTER — Ambulatory Visit (HOSPITAL_COMMUNITY)
Admission: RE | Admit: 2018-10-30 | Discharge: 2018-10-30 | Disposition: A | Discharge status: Home / Self Care | Payer: Medicare Other | Source: Ambulatory Visit | Attending: Cardiology | Admitting: Cardiology

## 2018-10-30 DIAGNOSIS — I5032 Chronic diastolic (congestive) heart failure: Secondary | ICD-10-CM | POA: Diagnosis not present

## 2018-10-30 DIAGNOSIS — R600 Localized edema: Secondary | ICD-10-CM | POA: Diagnosis not present

## 2018-11-02 ENCOUNTER — Encounter (HOSPITAL_COMMUNITY): Payer: Medicare Other

## 2018-11-03 ENCOUNTER — Encounter (HOSPITAL_COMMUNITY): Payer: Medicare Other

## 2018-11-06 ENCOUNTER — Ambulatory Visit: Payer: Medicare Other | Admitting: Cardiology

## 2018-11-06 ENCOUNTER — Telehealth: Payer: Self-pay | Admitting: *Deleted

## 2018-11-06 ENCOUNTER — Other Ambulatory Visit: Payer: Self-pay | Admitting: *Deleted

## 2018-11-06 DIAGNOSIS — I872 Venous insufficiency (chronic) (peripheral): Secondary | ICD-10-CM

## 2018-11-06 DIAGNOSIS — R600 Localized edema: Secondary | ICD-10-CM

## 2018-11-06 NOTE — Telephone Encounter (Signed)
PATIENT IN OFFICE WITH WIFE . DR HARDING SPOKE TO PATIENT ABOUT  LOWER EXTREMITIES DOPPLER  STUDIES--  PER ORDER,   APPOINTMENT NEED WITH DR EARLY - [facility] DOCTORS  ORDER PLACED- REFERRAL - VENOUS DOPPLER

## 2018-11-07 ENCOUNTER — Telehealth: Payer: Self-pay | Admitting: Cardiology

## 2018-11-07 NOTE — Telephone Encounter (Signed)
Spoke to [facility]  Scheduler- per Juliann Pulse (lead doppler tech)  states patient would need to have the superficial  thrombus resolved prior to any further testing.Before patient can see Dr Donnetta Hutching. The  Referral is being cancelled.       Will defer to Dr Ellyn Hack.

## 2018-11-07 NOTE — Telephone Encounter (Signed)
New Message           [place] with [facility] called to give information about patient.

## 2018-11-09 ENCOUNTER — Ambulatory Visit: Payer: Medicare Other | Admitting: Podiatry

## 2018-11-10 NOTE — Telephone Encounter (Signed)
Lets recheck LE doppler on the R leg in ~2 months.  Glenetta Hew, MD

## 2018-11-14 ENCOUNTER — Inpatient Hospital Stay (HOSPITAL_COMMUNITY)
Admission: EM | Admit: 2018-11-14 | Discharge: 2018-11-17 | DRG: 871 | Disposition: A | Discharge status: Home / Self Care | Payer: Medicare Other | Attending: Internal Medicine | Admitting: Internal Medicine

## 2018-11-14 ENCOUNTER — Encounter (HOSPITAL_COMMUNITY): Payer: Self-pay | Admitting: Emergency Medicine

## 2018-11-14 ENCOUNTER — Emergency Department (HOSPITAL_COMMUNITY): Payer: Medicare Other

## 2018-11-14 DIAGNOSIS — D849 Immunodeficiency, unspecified: Secondary | ICD-10-CM | POA: Diagnosis present

## 2018-11-14 DIAGNOSIS — I10 Essential (primary) hypertension: Secondary | ICD-10-CM | POA: Diagnosis present

## 2018-11-14 DIAGNOSIS — E1140 Type 2 diabetes mellitus with diabetic neuropathy, unspecified: Secondary | ICD-10-CM | POA: Diagnosis present

## 2018-11-14 DIAGNOSIS — Z888 Allergy status to other drugs, medicaments and biological substances status: Secondary | ICD-10-CM

## 2018-11-14 DIAGNOSIS — I5032 Chronic diastolic (congestive) heart failure: Secondary | ICD-10-CM | POA: Diagnosis present

## 2018-11-14 DIAGNOSIS — L97519 Non-pressure chronic ulcer of other part of right foot with unspecified severity: Secondary | ICD-10-CM | POA: Diagnosis present

## 2018-11-14 DIAGNOSIS — E119 Type 2 diabetes mellitus without complications: Secondary | ICD-10-CM

## 2018-11-14 DIAGNOSIS — G4733 Obstructive sleep apnea (adult) (pediatric): Secondary | ICD-10-CM | POA: Diagnosis present

## 2018-11-14 DIAGNOSIS — Z7901 Long term (current) use of anticoagulants: Secondary | ICD-10-CM

## 2018-11-14 DIAGNOSIS — E11621 Type 2 diabetes mellitus with foot ulcer: Secondary | ICD-10-CM | POA: Diagnosis present

## 2018-11-14 DIAGNOSIS — Z789 Other specified health status: Secondary | ICD-10-CM | POA: Diagnosis present

## 2018-11-14 DIAGNOSIS — E1122 Type 2 diabetes mellitus with diabetic chronic kidney disease: Secondary | ICD-10-CM | POA: Diagnosis present

## 2018-11-14 DIAGNOSIS — I872 Venous insufficiency (chronic) (peripheral): Secondary | ICD-10-CM | POA: Diagnosis present

## 2018-11-14 DIAGNOSIS — E669 Obesity, unspecified: Secondary | ICD-10-CM | POA: Diagnosis present

## 2018-11-14 DIAGNOSIS — Z7951 Long term (current) use of inhaled steroids: Secondary | ICD-10-CM

## 2018-11-14 DIAGNOSIS — A419 Sepsis, unspecified organism: Principal | ICD-10-CM | POA: Diagnosis present

## 2018-11-14 DIAGNOSIS — Z79899 Other long term (current) drug therapy: Secondary | ICD-10-CM

## 2018-11-14 DIAGNOSIS — L84 Corns and callosities: Secondary | ICD-10-CM | POA: Diagnosis present

## 2018-11-14 DIAGNOSIS — Z833 Family history of diabetes mellitus: Secondary | ICD-10-CM

## 2018-11-14 DIAGNOSIS — J189 Pneumonia, unspecified organism: Secondary | ICD-10-CM

## 2018-11-14 DIAGNOSIS — I130 Hypertensive heart and chronic kidney disease with heart failure and stage 1 through stage 4 chronic kidney disease, or unspecified chronic kidney disease: Secondary | ICD-10-CM | POA: Diagnosis present

## 2018-11-14 DIAGNOSIS — Z880 Allergy status to penicillin: Secondary | ICD-10-CM

## 2018-11-14 DIAGNOSIS — E8881 Metabolic syndrome: Secondary | ICD-10-CM | POA: Diagnosis present

## 2018-11-14 DIAGNOSIS — J181 Lobar pneumonia, unspecified organism: Secondary | ICD-10-CM | POA: Diagnosis not present

## 2018-11-14 DIAGNOSIS — Z881 Allergy status to other antibiotic agents status: Secondary | ICD-10-CM

## 2018-11-14 DIAGNOSIS — J440 Chronic obstructive pulmonary disease with acute lower respiratory infection: Secondary | ICD-10-CM | POA: Diagnosis present

## 2018-11-14 DIAGNOSIS — Z86718 Personal history of other venous thrombosis and embolism: Secondary | ICD-10-CM

## 2018-11-14 DIAGNOSIS — I480 Paroxysmal atrial fibrillation: Secondary | ICD-10-CM | POA: Diagnosis present

## 2018-11-14 DIAGNOSIS — N184 Chronic kidney disease, stage 4 (severe): Secondary | ICD-10-CM | POA: Diagnosis present

## 2018-11-14 DIAGNOSIS — G8929 Other chronic pain: Secondary | ICD-10-CM | POA: Diagnosis present

## 2018-11-14 DIAGNOSIS — Z794 Long term (current) use of insulin: Secondary | ICD-10-CM

## 2018-11-14 DIAGNOSIS — R600 Localized edema: Secondary | ICD-10-CM

## 2018-11-14 DIAGNOSIS — J310 Chronic rhinitis: Secondary | ICD-10-CM | POA: Diagnosis present

## 2018-11-14 DIAGNOSIS — Z9081 Acquired absence of spleen: Secondary | ICD-10-CM

## 2018-11-14 DIAGNOSIS — N400 Enlarged prostate without lower urinary tract symptoms: Secondary | ICD-10-CM | POA: Diagnosis present

## 2018-11-14 DIAGNOSIS — J449 Chronic obstructive pulmonary disease, unspecified: Secondary | ICD-10-CM | POA: Diagnosis present

## 2018-11-14 DIAGNOSIS — Z87891 Personal history of nicotine dependence: Secondary | ICD-10-CM

## 2018-11-14 LAB — COMPREHENSIVE METABOLIC PANEL
ALK PHOS: 81 U/L (ref 38–126)
ALT: 18 U/L (ref 0–44)
AST: 22 U/L (ref 15–41)
Albumin: 2.7 g/dL — ABNORMAL LOW (ref 3.5–5.0)
Anion gap: 10 (ref 5–15)
BUN: 34 mg/dL — ABNORMAL HIGH (ref 8–23)
CALCIUM: 9 mg/dL (ref 8.9–10.3)
CO2: 22 mmol/L (ref 22–32)
Chloride: 103 mmol/L (ref 98–111)
Creatinine, Ser: 2.31 mg/dL — ABNORMAL HIGH (ref 0.61–1.24)
GFR calc Af Amer: 30 mL/min — ABNORMAL LOW (ref >60)
GFR calc non Af Amer: 26 mL/min — ABNORMAL LOW (ref >60)
Glucose, Bld: 189 mg/dL — ABNORMAL HIGH (ref 70–99)
Potassium: 4.4 mmol/L (ref 3.5–5.1)
Sodium: 135 mmol/L (ref 135–145)
Total Bilirubin: 0.6 mg/dL (ref 0.3–1.2)
Total Protein: 6.6 g/dL (ref 6.5–8.1)

## 2018-11-14 LAB — CBC WITH DIFFERENTIAL/PLATELET
Abs Immature Granulocytes: 0.31 10*3/uL — ABNORMAL HIGH (ref 0.00–0.07)
Basophils Absolute: 0.1 10*3/uL (ref 0.0–0.1)
Basophils Relative: 0 %
Eosinophils Absolute: 0.1 10*3/uL (ref 0.0–0.5)
Eosinophils Relative: 0 %
HCT: 35.3 % — ABNORMAL LOW (ref 39.0–52.0)
Hemoglobin: 11.4 g/dL — ABNORMAL LOW (ref 13.0–17.0)
Immature Granulocytes: 1 %
LYMPHS ABS: 1.4 10*3/uL (ref 0.7–4.0)
Lymphocytes Relative: 5 %
MCH: 31 pg (ref 26.0–34.0)
MCHC: 32.3 g/dL (ref 30.0–36.0)
MCV: 95.9 fL (ref 80.0–100.0)
MONO ABS: 2.3 10*3/uL — AB (ref 0.1–1.0)
Monocytes Relative: 8 %
Neutro Abs: 26.2 10*3/uL — ABNORMAL HIGH (ref 1.7–7.7)
Neutrophils Relative %: 86 %
Platelets: 276 10*3/uL (ref 150–400)
RBC: 3.68 MIL/uL — ABNORMAL LOW (ref 4.22–5.81)
RDW: 15.9 % — ABNORMAL HIGH (ref 11.5–15.5)
WBC: 30.4 10*3/uL — ABNORMAL HIGH (ref 4.0–10.5)
nRBC: 0 % (ref 0.0–0.2)

## 2018-11-14 LAB — LACTIC ACID, PLASMA: Lactic Acid, Venous: 1.7 mmol/L (ref 0.5–1.9)

## 2018-11-14 LAB — URINALYSIS, ROUTINE W REFLEX MICROSCOPIC
Bacteria, UA: NONE SEEN
Bilirubin Urine: NEGATIVE
GLUCOSE, UA: 50 mg/dL — AB
HGB URINE DIPSTICK: NEGATIVE
Ketones, ur: NEGATIVE mg/dL
Leukocytes,Ua: NEGATIVE
Nitrite: NEGATIVE
PROTEIN: 100 mg/dL — AB
Specific Gravity, Urine: 1.014 (ref 1.005–1.030)
pH: 5 (ref 5.0–8.0)

## 2018-11-14 LAB — INFLUENZA PANEL BY PCR (TYPE A & B)
Influenza A By PCR: NEGATIVE
Influenza B By PCR: NEGATIVE

## 2018-11-14 MED ORDER — VANCOMYCIN HCL IN DEXTROSE 1-5 GM/200ML-% IV SOLN
1000.0000 mg | Freq: Once | INTRAVENOUS | Status: AC
  Administered 2018-11-14: 1000 mg via INTRAVENOUS
  Filled 2018-11-14: qty 200

## 2018-11-14 MED ORDER — SODIUM CHLORIDE 0.9 % IV SOLN: 500.0000 mg | INTRAVENOUS | Status: DC

## 2018-11-14 MED ORDER — LEVOFLOXACIN IN D5W 750 MG/150ML IV SOLN
750.0000 mg | Freq: Once | INTRAVENOUS | Status: AC
  Administered 2018-11-14: 750 mg via INTRAVENOUS
  Filled 2018-11-14: qty 150

## 2018-11-14 MED ORDER — SODIUM CHLORIDE 0.9 % IV SOLN: 2.0000 g | Freq: Once | INTRAVENOUS | Status: DC

## 2018-11-14 NOTE — ED Notes (Addendum)
Blood cultures collected and sent to lab , Vancomycin / Levofloxacin IV antibiotics infusing , IV sites intact , plan of care explained by EDP to pt. and family . Respirations unlabored , O2 sat= 97% room air , denies pain .

## 2018-11-14 NOTE — ED Provider Notes (Signed)
[facility] EMERGENCY DEPARTMENT Provider Note   CSN: [id number] Arrival date & time: 11/14/18  2144    History   Chief Complaint Chief Complaint  Patient presents with  . Flu Symptoms    HPI Jeffery [name] is a 78 y.o. male.     HPI  78 year old male with history of COPD, diabetes, immunodeficiency syndrome, CKD, paroxysmal A. Fib comes to the ER with chief complaint of cough. Patient states that his been feeling unwell for the last week.  He has been having cough that is getting worse.  He was given azithromycin by his PCP that he finished 2 days ago.  This morning he woke up and started noticing that he was having severe weakness, chills and diaphoresis.  Patient arrives to the ER with fevers and tachycardia along with tachypnea.  He denies any other antibiotic usage within the last 90 days or any hospital admissions.  Past Medical History:  Diagnosis Date  . Angio-edema   . BPH (benign prostatic hyperplasia)   . Cognitive deficits   . COPD (chronic obstructive pulmonary disease) ([place])   . Degeneration of lumbar intervertebral disc   . Diabetes mellitus with neuropathy ([place])   . DVT of axillary vein, chronic, left ([place]) 2017   DVT related to PICC line -PICC line was then placed for long-term IV antibiotics in the setting of osteomyelitis.  Marland Kitchen History of syncope    Hospitalized December 2018. --> EVENT MONITOR: Sinus rhythm with sinus bradycardia, rate lowest 46 bpm.  Minimal PVCs.  Multiple PACs.  No atrial fibrillation.  No arrhythmias.  No indication for pacemaker..  Ambulatory BP recording also done - see HTN  . Hypertension    LABILE: Ambulatory BP monitor 01/2018: Average blood pressure 147/71 mmHg.  Max 180/86 mmHg.  Minimum 106/50 mmHg.  85% of the time, elevated readings noted.  At that time losartan was increased to 100 mg.  . Immunodeficiency syndrome ([place])   . Kidney disease   . Metabolic syndrome    Diabetes, hypertension, obesity  .  Obesity (BMI 30.0-34.9)    BMI of 32.8  . Osteoarthritis   . Osteomyelitis ([place]) 2017   Toe  . Paroxysmal atrial fibrillation --by report, NOT confirmed    This was per patient report, but reviewing his prior cardiologist's notes did not comment anything about atrial fibrillation.  . Pituitary neoplasm   . S/P splenectomy   . 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 Sleep apnea    On home oxygen, but not CPAP.  Is pending pulmonary medicine referral  . Venous insufficiency (chronic) (peripheral)     Patient Active Problem List   Diagnosis Date Noted  . Sepsis due to pneumonia ([place]) 11/15/2018  . CKD (chronic kidney disease), stage IV ([place]) 11/15/2018  . Insulin-requiring or dependent type II diabetes mellitus ([place]) 11/15/2018  . Chronic pain 11/15/2018  . History of syncope 10/04/2018  . Diabetes ([place]) 09/27/2018  . OSA (obstructive sleep apnea) 09/18/2018  . Essential hypertension 09/18/2018  . Bilateral lower extremity edema 09/18/2018  . Chronic diastolic (congestive) heart failure ([place]) 09/18/2018  . PAF (paroxysmal atrial fibrillation) ([place]) 09/18/2018  . Generalized abdominal pain 08/10/2018  . 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 Chronic obstructive pulmonary disease 06/07/2018  . Chronic rhinitis 06/07/2018    Past Surgical History:  Procedure Laterality Date  . APPENDECTOMY    . GALLBLADDER SURGERY    . HERNIA REPAIR    . LUMBAR DISC SURGERY    .  NM MYOVIEW LTD  09/2017   [facility] [place]) -> scanned into Epic: a) September 2016: Normal LV function.  No ischemia or infarction.;; b) January 2019: -EF 65%.  No ischemia or infarction.  LOW RISK.  Mild inferior defect, likely diaphragmatic attenuation  . PAIN PUMP IMPLANTATION    . SPLENECTOMY, TOTAL    . TEMPORAL ARTERY BIOPSY / LIGATION    . TONSILLECTOMY    . TRANSTHORACIC ECHOCARDIOGRAM  09/2017   [facility] [place]) -> scanned into  Epic:  a) 05/2015: EF 55 to 60%.  GR 1 DD.  Severe LA dilation.  Aortic sclerosis but no stenosis.;; b) Mild LVH.  EF 55-60%.  GR 1 DD.  Trace mitral and tricuspid regurgitation.  Normal RA pressures.        Home Medications    Prior to Admission medications   Medication Sig Start Date End Date Taking? Authorizing Provider  allopurinol (ZYLOPRIM) 300 MG tablet Take 300 mg by mouth daily.  05/10/18  Yes [provider]  amLODipine (NORVASC) 10 MG tablet Take 10 mg by mouth daily.  04/08/18  Yes [provider]  calcitRIOL (ROCALTROL) 0.25 MCG capsule Take 0.25 mcg by mouth daily.  03/31/18  Yes [provider]  donepezil (ARICEPT) 10 MG tablet Take 10 mg by mouth at bedtime.  04/23/18  Yes [provider]  finasteride (PROSCAR) 5 MG tablet Take 5 mg by mouth daily.  03/09/18  Yes [provider]  Fluticasone-Umeclidin-Vilant (TRELEGY ELLIPTA IN) Inhale 2 puffs into the lungs daily.    Yes [provider]  furosemide (LASIX) 20 MG tablet TAKE 20 MG TABLET EVERY DAY , MAY TAKE AN EXTRA 20 MG FOR INCREASE  SWELLING Patient taking differently: Take 20 mg by mouth daily.  10/03/18  Yes Leonie Man, MD  gabapentin (NEURONTIN) 300 MG capsule Take 300 mg by mouth 3 (three) times daily.  05/29/18  Yes [provider]  GAMMAGARD 20 GM/200ML SOLN Inject into the vein every 30 (thirty) days.  05/01/18  Yes [provider]  Insulin Glargine (LANTUS SOLOSTAR) 100 UNIT/ML Solostar Pen Inject 25 Units into the skin every morning. Patient taking differently: Inject 25 Units into the skin daily.  09/27/18  Yes Renato Shin, MD  insulin lispro (HUMALOG KWIKPEN) 100 UNIT/ML KwikPen Inject 0.04 mLs (4 Units total) into the skin 3 (three) times daily with meals. And pen needles 4/day 09/27/18  Yes Renato Shin, MD  losartan (COZAAR) 100 MG tablet Take 100 mg by mouth daily.  04/21/18  Yes [provider]  montelukast (SINGULAIR) 10 MG tablet  Take 10 mg by mouth at bedtime.   Yes [provider]  mupirocin ointment (BACTROBAN) 2 % APPLY TO WOUND TWICE DAILY AFTER SOAKING Patient taking differently: Apply 1 application topically 2 (two) times daily.  10/17/18  Yes Hyatt, Max T, DPM  omeprazole (PRILOSEC) 10 MG capsule Take by mouth daily.    Yes [provider]  oxyCODONE (ROXICODONE) 15 MG immediate release tablet Take 15 mg by mouth every 6 (six) hours as needed for pain.   Yes [provider]  OXYCONTIN 15 MG 12 hr tablet Take 15 mg by mouth every 12 (twelve) hours.  05/10/18  Yes [provider]  tamsulosin (FLOMAX) 0.4 MG CAPS capsule Take 0.4 mg by mouth daily.  04/07/18  Yes [provider]  XARELTO 15 MG TABS tablet Take 15 mg by mouth daily with supper.  04/07/18  Yes [provider]    Family History Family History  Problem Relation Age of Onset  . Diabetes Brother     Social History Social History   Tobacco Use  . Smoking status: Former Smoker    Last attempt to quit: 06/07/1988    Years since quitting: 30.4  . Smokeless tobacco: Never Used  Substance Use Topics  . Alcohol use: Never    Frequency: Never  . Drug use: Never     Allergies   Betadine [povidone iodine]; Keflex [cephalexin]; and Penicillins   Review of Systems Review of Systems  Constitutional: Positive for activity change.  Respiratory: Positive for cough and shortness of breath.   Cardiovascular: Positive for chest pain.  Gastrointestinal: Positive for nausea.  Allergic/Immunologic: Positive for immunocompromised state.  All other systems reviewed and are negative.    Physical Exam Updated Vital Signs BP (!) 144/69   Pulse 79   Temp (!) 100.7 F (38.2 C) (Oral)   Resp (!) 23   Ht 5\' 8"  (1.727 m)   Wt 81.6 kg   SpO2 97%   BMI 27.37 kg/m   Physical Exam Vitals signs and nursing note reviewed.  Constitutional:      Appearance: He is well-developed.  HENT:     Head:  Atraumatic.  Neck:     Musculoskeletal: Neck supple.  Cardiovascular:     Rate and Rhythm: Normal rate.  Pulmonary:     Effort: Pulmonary effort is normal.     Breath sounds: Rhonchi present. No wheezing or rales.     Comments: Right lower lung field rhonchi Musculoskeletal:     Right lower leg: No edema.     Left lower leg: No edema.  Skin:    General: Skin is warm.  Neurological:     Mental Status: He is alert and oriented to person, place, and time.      ED Treatments / Results  Labs (all labs ordered are listed, but only abnormal results are displayed) Labs Reviewed  CBC WITH DIFFERENTIAL/PLATELET - Abnormal; Notable for the following components:      Result Value   WBC 30.4 (*)    RBC 3.68 (*)    Hemoglobin 11.4 (*)    HCT 35.3 (*)    RDW 15.9 (*)    Neutro Abs 26.2 (*)    Monocytes Absolute 2.3 (*)    Abs Immature Granulocytes 0.31 (*)    All other components within normal limits  COMPREHENSIVE METABOLIC PANEL - Abnormal; Notable for the following components:   Glucose, Bld 189 (*)    BUN 34 (*)    Creatinine, Ser 2.31 (*)    Albumin 2.7 (*)    GFR calc non Af Amer 26 (*)    GFR calc Af Amer 30 (*)    All other components within normal limits  URINALYSIS, ROUTINE W REFLEX MICROSCOPIC - Abnormal; Notable for the following components:   Glucose, UA 50 (*)    Protein, ur 100 (*)    All other components within normal limits  CULTURE, BLOOD (ROUTINE X 2)  CULTURE, BLOOD (ROUTINE X 2)  EXPECTORATED SPUTUM ASSESSMENT W REFEX TO RESP CULTURE  GRAM STAIN  INFLUENZA PANEL BY PCR (TYPE A & B)  LACTIC ACID, PLASMA  STREP PNEUMONIAE URINARY ANTIGEN  BASIC METABOLIC PANEL  CBC WITH DIFFERENTIAL/PLATELET    EKG EKG Interpretation  Date/Time:  Tuesday November 14 2018 21:56:04 EST Ventricular Rate:  87 PR Interval:    QRS Duration:  89 QT Interval:  377 QTC Calculation: 024 R Axis:   10 Text Interpretation:  Sinus rhythm Multiple premature complexes, vent &  supraven Abnormal R-wave progression, early transition No acute changes No significant change since last tracing Confirmed by Varney Biles [phone]) on 11/14/2018 9:59:31 PM   Radiology Dg Chest 2 View  Result Date: 11/14/2018 CLINICAL DATA:  Initial evaluation for acute fever, flu symptoms. EXAM: CHEST - 2 VIEW COMPARISON:  Prior radiograph from 09/24/2018. FINDINGS: Patient is markedly rotated to the right. Cardiomegaly grossly stable. Mediastinal silhouette within normal limits. Lungs mildly hypoinflated. Mild diffuse interstitial congestion without frank pulmonary edema. Superimposed confluent right basilar opacity could reflect atelectasis or infiltrate. Small right pleural effusion. No pneumothorax. No acute osseous finding. IMPRESSION: 1. Cardiomegaly with mild diffuse pulmonary interstitial congestion and small right pleural effusion. 2. Associated right basilar opacity favored to reflect atelectasis, although infiltrate could be considered in the correct clinical setting. Electronically Signed   By: Jeannine Boga M.D.   On: 11/14/2018 22:30    Procedures Procedures (including critical care time)  Medications Ordered in ED Medications  allopurinol (ZYLOPRIM) tablet 300 mg (has no administration in time range)  oxyCODONE (Oxy IR/ROXICODONE) immediate release tablet 15 mg (has no administration in time range)  oxyCODONE (OXYCONTIN) 12 hr tablet 15 mg (has no administration in time range)  amLODipine (NORVASC) tablet 10 mg (has no administration in time range)  donepezil (ARICEPT) tablet 10 mg (has no administration in time range)  calcitRIOL (ROCALTROL) capsule 0.25 mcg (has no administration in time range)  pantoprazole (PROTONIX) EC tablet 40 mg (has no administration in time range)  finasteride (PROSCAR) tablet 5 mg (has no administration in time range)  tamsulosin (FLOMAX) capsule 0.4 mg (has no administration in time range)  Rivaroxaban (XARELTO) tablet 15 mg (has no  administration in time range)  gabapentin (NEURONTIN) capsule 100 mg (has no administration in time range)  Fluticasone-Umeclidin-Vilant 100-62.5-25 MCG/INH AEPB (has no administration in time range)  montelukast (SINGULAIR) tablet 10 mg (has no administration in time range)  sodium chloride flush (NS) 0.9 % injection 3 mL (has no administration in time range)  sodium chloride flush (NS) 0.9 % injection 3 mL (has no administration in time range)  0.9 %  sodium chloride infusion (has no administration in time range)  acetaminophen (TYLENOL) tablet 650 mg (has no administration in time range)    Or  acetaminophen (TYLENOL) suppository 650 mg (has no administration in time range)  polyethylene glycol (MIRALAX / GLYCOLAX) packet 17 g (has no administration in time range)  ondansetron (ZOFRAN) tablet 4 mg (has no administration in time range)    Or  ondansetron (ZOFRAN) injection 4 mg (has no administration in time range)  insulin glargine (LANTUS) injection 15 Units (has no administration in time range)  insulin aspart (novoLOG) injection 0-15 Units (has no administration in time range)  insulin aspart (novoLOG) injection 0-5 Units (has no administration in time range)  vancomycin (VANCOCIN) IVPB 1000 mg/200 mL premix (0 mg Intravenous Stopped 11/15/18 0011)  levofloxacin (LEVAQUIN) IVPB 750 mg (0 mg Intravenous Stopped 11/15/18 0011)     Initial Impression / Assessment and Plan / ED Course  I have reviewed the triage vital signs and the nursing notes.  Pertinent labs & imaging results that were available during my care of the patient were reviewed by me and considered in my medical decision making (see chart for details).        78 year old male comes in with chief  complaint of fevers, chills.  He has been sick for the last 1 week, however today he started having fevers, chills and weakness, diaphoresis.  On exam patient did have mild right-sided rhonchi and the x-ray showing right lower  lobe infiltrate.  He might be having evolving pneumonia.  White count is elevated, patient is febrile -we have sent influenza screen, however clinically it appears that he has a developing pneumonia.  Patient's labs show mild creatinine elevation.  He has known history of CKD, and the creatinine is not significantly elevated.  Lactic acid is 1.7.  Flu swab also have resulted and are negative.  At this time it appears unlikely that patient is septic from his pneumonia, however he has a high port score and recent outpatient azithromycin that did not help, therefore we think he will benefit by admission to the hospital.   Final Clinical Impressions(s) / ED Diagnoses   Final diagnoses:  Community acquired pneumonia of right lower lobe of lung [facility])    ED Discharge Orders    None       Varney Biles, MD 11/15/18 [phone]

## 2018-11-14 NOTE — ED Triage Notes (Signed)
Patient arrived with PTAR from [facility] facility reports chills , fever , fatigue onset today , recently treated with antibiotic ( Zithromax) for pneumonia , denies SOB / clear lung sounds .

## 2018-11-15 ENCOUNTER — Telehealth: Payer: Self-pay | Admitting: Allergy & Immunology

## 2018-11-15 ENCOUNTER — Encounter (HOSPITAL_COMMUNITY): Payer: Self-pay | Admitting: Family Medicine

## 2018-11-15 DIAGNOSIS — J449 Chronic obstructive pulmonary disease, unspecified: Secondary | ICD-10-CM

## 2018-11-15 DIAGNOSIS — I5032 Chronic diastolic (congestive) heart failure: Secondary | ICD-10-CM

## 2018-11-15 DIAGNOSIS — E8881 Metabolic syndrome: Secondary | ICD-10-CM | POA: Diagnosis present

## 2018-11-15 DIAGNOSIS — G8929 Other chronic pain: Secondary | ICD-10-CM | POA: Diagnosis present

## 2018-11-15 DIAGNOSIS — E1140 Type 2 diabetes mellitus with diabetic neuropathy, unspecified: Secondary | ICD-10-CM | POA: Diagnosis present

## 2018-11-15 DIAGNOSIS — I872 Venous insufficiency (chronic) (peripheral): Secondary | ICD-10-CM | POA: Diagnosis present

## 2018-11-15 DIAGNOSIS — N184 Chronic kidney disease, stage 4 (severe): Secondary | ICD-10-CM | POA: Diagnosis present

## 2018-11-15 DIAGNOSIS — N400 Enlarged prostate without lower urinary tract symptoms: Secondary | ICD-10-CM | POA: Diagnosis present

## 2018-11-15 DIAGNOSIS — E669 Obesity, unspecified: Secondary | ICD-10-CM | POA: Diagnosis present

## 2018-11-15 DIAGNOSIS — E1122 Type 2 diabetes mellitus with diabetic chronic kidney disease: Secondary | ICD-10-CM | POA: Diagnosis present

## 2018-11-15 DIAGNOSIS — L84 Corns and callosities: Secondary | ICD-10-CM | POA: Diagnosis present

## 2018-11-15 DIAGNOSIS — L97519 Non-pressure chronic ulcer of other part of right foot with unspecified severity: Secondary | ICD-10-CM | POA: Diagnosis present

## 2018-11-15 DIAGNOSIS — G4733 Obstructive sleep apnea (adult) (pediatric): Secondary | ICD-10-CM | POA: Diagnosis present

## 2018-11-15 DIAGNOSIS — I480 Paroxysmal atrial fibrillation: Secondary | ICD-10-CM | POA: Diagnosis present

## 2018-11-15 DIAGNOSIS — A419 Sepsis, unspecified organism: Secondary | ICD-10-CM | POA: Diagnosis present

## 2018-11-15 DIAGNOSIS — Z833 Family history of diabetes mellitus: Secondary | ICD-10-CM | POA: Diagnosis not present

## 2018-11-15 DIAGNOSIS — Z7901 Long term (current) use of anticoagulants: Secondary | ICD-10-CM | POA: Diagnosis not present

## 2018-11-15 DIAGNOSIS — E11621 Type 2 diabetes mellitus with foot ulcer: Secondary | ICD-10-CM | POA: Diagnosis present

## 2018-11-15 DIAGNOSIS — J440 Chronic obstructive pulmonary disease with acute lower respiratory infection: Secondary | ICD-10-CM | POA: Diagnosis present

## 2018-11-15 DIAGNOSIS — J310 Chronic rhinitis: Secondary | ICD-10-CM | POA: Diagnosis present

## 2018-11-15 DIAGNOSIS — J181 Lobar pneumonia, unspecified organism: Secondary | ICD-10-CM | POA: Diagnosis present

## 2018-11-15 DIAGNOSIS — J189 Pneumonia, unspecified organism: Secondary | ICD-10-CM | POA: Diagnosis present

## 2018-11-15 DIAGNOSIS — E119 Type 2 diabetes mellitus without complications: Secondary | ICD-10-CM

## 2018-11-15 DIAGNOSIS — I130 Hypertensive heart and chronic kidney disease with heart failure and stage 1 through stage 4 chronic kidney disease, or unspecified chronic kidney disease: Secondary | ICD-10-CM | POA: Diagnosis present

## 2018-11-15 DIAGNOSIS — D849 Immunodeficiency, unspecified: Secondary | ICD-10-CM | POA: Diagnosis present

## 2018-11-15 DIAGNOSIS — Z794 Long term (current) use of insulin: Secondary | ICD-10-CM

## 2018-11-15 DIAGNOSIS — Z9081 Acquired absence of spleen: Secondary | ICD-10-CM | POA: Diagnosis not present

## 2018-11-15 DIAGNOSIS — Z86718 Personal history of other venous thrombosis and embolism: Secondary | ICD-10-CM | POA: Diagnosis not present

## 2018-11-15 LAB — BASIC METABOLIC PANEL
Anion gap: 9 (ref 5–15)
BUN: 34 mg/dL — ABNORMAL HIGH (ref 8–23)
CO2: 24 mmol/L (ref 22–32)
Calcium: 8.6 mg/dL — ABNORMAL LOW (ref 8.9–10.3)
Chloride: 104 mmol/L (ref 98–111)
Creatinine, Ser: 2.18 mg/dL — ABNORMAL HIGH (ref 0.61–1.24)
GFR calc Af Amer: 33 mL/min — ABNORMAL LOW (ref >60)
GFR calc non Af Amer: 28 mL/min — ABNORMAL LOW (ref >60)
Glucose, Bld: 151 mg/dL — ABNORMAL HIGH (ref 70–99)
Potassium: 4.7 mmol/L (ref 3.5–5.1)
Sodium: 137 mmol/L (ref 135–145)

## 2018-11-15 LAB — MRSA PCR SCREENING: MRSA by PCR: NEGATIVE

## 2018-11-15 LAB — GLUCOSE, CAPILLARY
Glucose-Capillary: 169 mg/dL — ABNORMAL HIGH (ref 70–99)
Glucose-Capillary: 180 mg/dL — ABNORMAL HIGH (ref 70–99)
Glucose-Capillary: 98 mg/dL (ref 70–99)
Glucose-Capillary: 198 mg/dL — ABNORMAL HIGH (ref 70–99)
Glucose-Capillary: 129 mg/dL — ABNORMAL HIGH (ref 70–99)

## 2018-11-15 LAB — CBC WITH DIFFERENTIAL/PLATELET
ABS IMMATURE GRANULOCYTES: 0.2 10*3/uL — AB (ref 0.00–0.07)
Basophils Absolute: 0.1 10*3/uL (ref 0.0–0.1)
Basophils Relative: 0 %
Eosinophils Absolute: 0.1 10*3/uL (ref 0.0–0.5)
Eosinophils Relative: 0 %
HEMATOCRIT: 32.3 % — AB (ref 39.0–52.0)
Hemoglobin: 10.4 g/dL — ABNORMAL LOW (ref 13.0–17.0)
Immature Granulocytes: 1 %
Lymphocytes Relative: 9 %
Lymphs Abs: 2.5 10*3/uL (ref 0.7–4.0)
MCH: 30.8 pg (ref 26.0–34.0)
MCHC: 32.2 g/dL (ref 30.0–36.0)
MCV: 95.6 fL (ref 80.0–100.0)
Monocytes Absolute: 1.4 10*3/uL — ABNORMAL HIGH (ref 0.1–1.0)
Monocytes Relative: 5 %
NEUTROS ABS: 24.6 10*3/uL — AB (ref 1.7–7.7)
Neutrophils Relative %: 85 %
Platelets: 244 10*3/uL (ref 150–400)
RBC: 3.38 MIL/uL — ABNORMAL LOW (ref 4.22–5.81)
RDW: 15.8 % — ABNORMAL HIGH (ref 11.5–15.5)
WBC: 28.8 10*3/uL — ABNORMAL HIGH (ref 4.0–10.5)
nRBC: 0 % (ref 0.0–0.2)

## 2018-11-15 LAB — STREP PNEUMONIAE URINARY ANTIGEN: Strep Pneumo Urinary Antigen: NEGATIVE

## 2018-11-15 LAB — PROCALCITONIN: Procalcitonin: 0.4 ng/mL

## 2018-11-15 MED ORDER — CALCITRIOL 0.25 MCG PO CAPS
0.2500 ug | ORAL_CAPSULE | Freq: Every day | ORAL | Status: DC
  Administered 2018-11-15 – 2018-11-17 (×3): 0.25 ug via ORAL
  Filled 2018-11-15 (×3): qty 1

## 2018-11-15 MED ORDER — OXYCODONE HCL ER 15 MG PO T12A
15.0000 mg | EXTENDED_RELEASE_TABLET | Freq: Two times a day (BID) | ORAL | Status: DC
  Administered 2018-11-15 – 2018-11-17 (×5): 15 mg via ORAL
  Filled 2018-11-15 (×5): qty 1

## 2018-11-15 MED ORDER — PANTOPRAZOLE SODIUM 40 MG PO TBEC
40.0000 mg | DELAYED_RELEASE_TABLET | Freq: Every day | ORAL | Status: DC
  Administered 2018-11-15 – 2018-11-17 (×3): 40 mg via ORAL
  Filled 2018-11-15 (×3): qty 1

## 2018-11-15 MED ORDER — INSULIN GLARGINE 100 UNIT/ML ~~LOC~~ SOLN
15.0000 [IU] | Freq: Every day | SUBCUTANEOUS | Status: DC
  Administered 2018-11-15 – 2018-11-17 (×3): 15 [IU] via SUBCUTANEOUS
  Filled 2018-11-15 (×3): qty 0.15

## 2018-11-15 MED ORDER — GABAPENTIN 100 MG PO CAPS
100.0000 mg | ORAL_CAPSULE | Freq: Two times a day (BID) | ORAL | Status: DC
  Administered 2018-11-15 – 2018-11-17 (×6): 100 mg via ORAL
  Filled 2018-11-15 (×6): qty 1

## 2018-11-15 MED ORDER — ACETAMINOPHEN 325 MG PO TABS
650.0000 mg | ORAL_TABLET | Freq: Four times a day (QID) | ORAL | Status: DC | PRN
  Administered 2018-11-15 – 2018-11-17 (×2): 650 mg via ORAL
  Filled 2018-11-15 (×2): qty 2

## 2018-11-15 MED ORDER — ALBUTEROL SULFATE (2.5 MG/3ML) 0.083% IN NEBU: 2.5000 mg | INHALATION_SOLUTION | Freq: Four times a day (QID) | RESPIRATORY_TRACT | Status: DC | PRN

## 2018-11-15 MED ORDER — SODIUM CHLORIDE 0.9 % IV SOLN
250.0000 mL | INTRAVENOUS | Status: DC | PRN
  Administered 2018-11-15: 250 mL via INTRAVENOUS

## 2018-11-15 MED ORDER — INSULIN ASPART 100 UNIT/ML ~~LOC~~ SOLN: 0.0000 [IU] | Freq: Every day | SUBCUTANEOUS | Status: DC

## 2018-11-15 MED ORDER — SODIUM CHLORIDE 0.9% FLUSH: 3.0000 mL | INTRAVENOUS | Status: DC | PRN

## 2018-11-15 MED ORDER — ACETAMINOPHEN 650 MG RE SUPP
650.0000 mg | Freq: Four times a day (QID) | RECTAL | Status: DC | PRN
  Filled 2018-11-15: qty 1

## 2018-11-15 MED ORDER — DONEPEZIL HCL 10 MG PO TABS
10.0000 mg | ORAL_TABLET | Freq: Every day | ORAL | Status: DC
  Administered 2018-11-15 – 2018-11-16 (×2): 10 mg via ORAL
  Filled 2018-11-15 (×2): qty 1

## 2018-11-15 MED ORDER — RIVAROXABAN 15 MG PO TABS
15.0000 mg | ORAL_TABLET | Freq: Every day | ORAL | Status: DC
  Administered 2018-11-15 – 2018-11-17 (×4): 15 mg via ORAL
  Filled 2018-11-15 (×5): qty 1

## 2018-11-15 MED ORDER — SODIUM CHLORIDE 0.9 % IV SOLN
1.0000 g | Freq: Three times a day (TID) | INTRAVENOUS | Status: DC
  Administered 2018-11-15 – 2018-11-17 (×7): 1 g via INTRAVENOUS
  Filled 2018-11-15 (×8): qty 1

## 2018-11-15 MED ORDER — UMECLIDINIUM BROMIDE 62.5 MCG/INH IN AEPB
1.0000 | INHALATION_SPRAY | Freq: Every day | RESPIRATORY_TRACT | Status: DC
  Administered 2018-11-15 – 2018-11-17 (×3): 1 via RESPIRATORY_TRACT
  Filled 2018-11-15: qty 7

## 2018-11-15 MED ORDER — ALLOPURINOL 300 MG PO TABS
300.0000 mg | ORAL_TABLET | Freq: Every day | ORAL | Status: DC
  Administered 2018-11-15 – 2018-11-17 (×3): 300 mg via ORAL
  Filled 2018-11-15 (×3): qty 1
  Filled 2018-11-15 (×2): qty 3

## 2018-11-15 MED ORDER — TAMSULOSIN HCL 0.4 MG PO CAPS
0.4000 mg | ORAL_CAPSULE | Freq: Every day | ORAL | Status: DC
  Administered 2018-11-15 – 2018-11-17 (×3): 0.4 mg via ORAL
  Filled 2018-11-15 (×3): qty 1

## 2018-11-15 MED ORDER — OXYCODONE HCL 5 MG PO TABS
15.0000 mg | ORAL_TABLET | Freq: Four times a day (QID) | ORAL | Status: DC | PRN
  Administered 2018-11-15 – 2018-11-17 (×8): 15 mg via ORAL
  Filled 2018-11-15 (×8): qty 3

## 2018-11-15 MED ORDER — FINASTERIDE 5 MG PO TABS
5.0000 mg | ORAL_TABLET | Freq: Every day | ORAL | Status: DC
  Administered 2018-11-15 – 2018-11-17 (×3): 5 mg via ORAL
  Filled 2018-11-15 (×3): qty 1

## 2018-11-15 MED ORDER — FLUTICASONE-UMECLIDIN-VILANT 100-62.5-25 MCG/INH IN AEPB: INHALATION_SPRAY | Freq: Every day | RESPIRATORY_TRACT | Status: DC

## 2018-11-15 MED ORDER — ONDANSETRON HCL 4 MG PO TABS: 4.0000 mg | ORAL_TABLET | Freq: Four times a day (QID) | ORAL | Status: DC | PRN

## 2018-11-15 MED ORDER — MONTELUKAST SODIUM 10 MG PO TABS
10.0000 mg | ORAL_TABLET | Freq: Every day | ORAL | Status: DC
  Administered 2018-11-15 – 2018-11-16 (×3): 10 mg via ORAL
  Filled 2018-11-15 (×3): qty 1

## 2018-11-15 MED ORDER — METRONIDAZOLE IN NACL 5-0.79 MG/ML-% IV SOLN
500.0000 mg | Freq: Three times a day (TID) | INTRAVENOUS | Status: DC
  Administered 2018-11-15 – 2018-11-17 (×8): 500 mg via INTRAVENOUS
  Filled 2018-11-15 (×8): qty 100

## 2018-11-15 MED ORDER — FLUTICASONE FUROATE-VILANTEROL 100-25 MCG/INH IN AEPB
1.0000 | INHALATION_SPRAY | Freq: Every day | RESPIRATORY_TRACT | Status: DC
  Administered 2018-11-15 – 2018-11-17 (×3): 1 via RESPIRATORY_TRACT
  Filled 2018-11-15: qty 28

## 2018-11-15 MED ORDER — VANCOMYCIN HCL IN DEXTROSE 750-5 MG/150ML-% IV SOLN
750.0000 mg | INTRAVENOUS | Status: DC
  Administered 2018-11-15: 750 mg via INTRAVENOUS
  Filled 2018-11-15: qty 150

## 2018-11-15 MED ORDER — ONDANSETRON HCL 4 MG/2ML IJ SOLN: 4.0000 mg | Freq: Four times a day (QID) | INTRAMUSCULAR | Status: DC | PRN

## 2018-11-15 MED ORDER — AMLODIPINE BESYLATE 10 MG PO TABS
10.0000 mg | ORAL_TABLET | Freq: Every day | ORAL | Status: DC
  Administered 2018-11-15 – 2018-11-17 (×3): 10 mg via ORAL
  Filled 2018-11-15 (×3): qty 1

## 2018-11-15 MED ORDER — POLYETHYLENE GLYCOL 3350 17 G PO PACK: 17.0000 g | PACK | Freq: Every day | ORAL | Status: DC | PRN

## 2018-11-15 MED ORDER — SODIUM CHLORIDE 0.9% FLUSH
3.0000 mL | Freq: Two times a day (BID) | INTRAVENOUS | Status: DC
  Administered 2018-11-15 – 2018-11-17 (×6): 3 mL via INTRAVENOUS

## 2018-11-15 MED ORDER — INSULIN ASPART 100 UNIT/ML ~~LOC~~ SOLN
0.0000 [IU] | Freq: Three times a day (TID) | SUBCUTANEOUS | Status: DC
  Administered 2018-11-15 (×2): 3 [IU] via SUBCUTANEOUS
  Administered 2018-11-16: 5 [IU] via SUBCUTANEOUS
  Administered 2018-11-16 (×2): 3 [IU] via SUBCUTANEOUS
  Administered 2018-11-17: 2 [IU] via SUBCUTANEOUS

## 2018-11-15 NOTE — Telephone Encounter (Signed)
Pt wife called to let you know that he in hospital with pneumonia and that he may not get IV Igg on march 2 .

## 2018-11-15 NOTE — Progress Notes (Signed)
PROGRESS NOTE    Jeffery [name]  MVE:[id number] DOB: 26-Nov-1940 DOA: 11/14/2018 PCP: Rosina Lowenstein, FNP    Brief Narrative: Old with past medical history significant for dementia, IgG deficiency, COPD, CAD HF diastolic, diabetes paroxysmal A. fib on Xarelto chronic kidney disease a stage III/IV who presents complaining of fevers chills.  He finished a course of azithromycin.  Evaluation in the ED he was found to have pneumonia with a white count of 38.  Assessment & Plan:   Principal Problem:   Sepsis ([place]) Active Problems:   Chronic obstructive pulmonary disease   OSA (obstructive sleep apnea)   Essential hypertension   Chronic diastolic (congestive) heart failure ([facility])   PAF (paroxysmal atrial fibrillation) ([facility])   CKD (chronic kidney disease), stage IV ([place])   Insulin-requiring or dependent type II diabetes mellitus ([facility])   Chronic pain   1-sepsis likely secondary to pneumonia: Continue with IV fluids. MRSA PCR negative will discontinue vancomycin.  Continue with aztreonam. Pete white blood cell in the morning.  COPD continue with Trelegy, PRN albuterol.  Chronic diastolic heart failure: Hold losartan and Lasix for now.  Chronic kidney disease a stage III/IV.  Creatinine on admission at 2.3.  Creatinine baseline 1.8-2.8. Monitor renal function daily.  Chronic pain, continue with gabapentin, oxycodone.  Diabetes treated with Lantus and a sliding scale.    Estimated body mass index is 27.37 kg/m as calculated from the following:   Height as of this encounter: 5\' 8"  (1.727 m).   Weight as of this encounter: 81.6 kg.   DVT prophylaxis: On Xarelto Code Status: Full Code Family Communication: Care discussed with wife over the phone Disposition Plan: Continue with IV antibiotics, white count significantly elevated  Consultants:   None      Antimicrobials: Aztreonam   Subjective: He reports dry cough.  Still feeling tired.  Objective: Vitals:   11/15/18 0845 11/15/18 0846 11/15/18 1138 11/15/18 1609  BP:   (!) 130/56 (!) 160/70  Pulse:   65 76  Resp:   18 18  Temp:   98.5 F (36.9 C) 99.1 F (37.3 C)  TempSrc:   Oral Oral  SpO2: 97% 98% 98% 94%  Weight:      Height:        Intake/Output Summary (Last 24 hours) at 11/15/2018 1627 Last data filed at 11/15/2018 0400 Gross per 24 hour  Intake 277.43 ml  Output -  Net 277.43 ml   Filed Weights   11/14/18 2239  Weight: 81.6 kg    Examination:  General exam: Appears calm and comfortable  Respiratory system: Bilateral crackles no wheezing Cardiovascular system: S1 & S2 heard, RRR. No JVD, murmurs, rubs, gallops or clicks. No pedal edema. Gastrointestinal system: Abdomen is nondistended, soft and nontender. No organomegaly or masses felt. Normal bowel sounds heard. Central nervous system: Alert and oriented. No focal neurological deficits. Extremities: Symmetric 5 x 5 power. Skin: No rashes, lesions or ulcers    Data Reviewed: I have personally reviewed following labs and imaging studies  CBC: Recent Labs  Lab 11/14/18 2211 11/15/18 0453  WBC 30.4* 28.8*  NEUTROABS 26.2* 24.6*  HGB 11.4* 10.4*  HCT 35.3* 32.3*  MCV 95.9 95.6  PLT 276 283   Basic Metabolic Panel: Recent Labs  Lab 11/14/18 2211 11/15/18 0453  NA 135 137  K 4.4 4.7  CL 103 104  CO2 22 24  GLUCOSE 189* 151*  BUN 34* 34*  CREATININE 2.31* 2.18*  CALCIUM 9.0 8.6*   GFR: Estimated  Creatinine Clearance: 27.5 mL/min (A) (by C-G formula based on SCr of 2.18 mg/dL (H)). Liver Function Tests: Recent Labs  Lab 11/14/18 2211  AST 22  ALT 18  ALKPHOS 81  BILITOT 0.6  PROT 6.6  ALBUMIN 2.7*   No results for input(s): LIPASE, AMYLASE in the last 168 hours. No results for input(s): AMMONIA in the last 168 hours. Coagulation Profile: No results for input(s): INR, PROTIME in the last 168 hours. Cardiac Enzymes: No results for input(s): CKTOTAL, CKMB, CKMBINDEX, TROPONINI in the last 168  hours. BNP (last 3 results) No results for input(s): PROBNP in the last 8760 hours. HbA1C: No results for input(s): HGBA1C in the last 72 hours. CBG: Recent Labs  Lab 11/15/18 0155 11/15/18 0648 11/15/18 1128  GLUCAP 169* 180* 98   Lipid Profile: No results for input(s): CHOL, HDL, LDLCALC, TRIG, CHOLHDL, LDLDIRECT in the last 72 hours. Thyroid Function Tests: No results for input(s): TSH, T4TOTAL, FREET4, T3FREE, THYROIDAB in the last 72 hours. Anemia Panel: No results for input(s): VITAMINB12, FOLATE, FERRITIN, TIBC, IRON, RETICCTPCT in the last 72 hours. Sepsis Labs: Recent Labs  Lab 11/14/18 2234 11/15/18 0453  PROCALCITON  --  0.40  LATICACIDVEN 1.7  --     Recent Results (from the past 240 hour(s))  Blood Culture (routine x 2)     Status: None (Preliminary result)   Collection Time: 11/14/18 10:34 PM  Result Value Ref Range Status   Specimen Description BLOOD LEFT ARM  Final   Special Requests   Final    BOTTLES DRAWN AEROBIC AND ANAEROBIC Blood Culture adequate volume   Culture   Final    NO GROWTH < 12 HOURS Performed at [facility], [place] [address] [place], [place] [address]    Report Status PENDING  Incomplete  MRSA PCR Screening     Status: None   Collection Time: 11/15/18  3:08 AM  Result Value Ref Range Status   MRSA by PCR NEGATIVE NEGATIVE Final    Comment:        The GeneXpert MRSA Assay (FDA approved for NASAL specimens only), is one component of a comprehensive MRSA colonization surveillance program. It is not intended to diagnose MRSA infection nor to guide or monitor treatment for MRSA infections. Performed at [facility], [place] [address] [place], [place] [address]          Radiology Studies: Dg Chest 2 View  Result Date: 11/14/2018 CLINICAL DATA:  Initial evaluation for acute fever, flu symptoms. EXAM: CHEST - 2 VIEW COMPARISON:  Prior radiograph from 09/24/2018. FINDINGS: Patient is markedly rotated to the right.  Cardiomegaly grossly stable. Mediastinal silhouette within normal limits. Lungs mildly hypoinflated. Mild diffuse interstitial congestion without frank pulmonary edema. Superimposed confluent right basilar opacity could reflect atelectasis or infiltrate. Small right pleural effusion. No pneumothorax. No acute osseous finding. IMPRESSION: 1. Cardiomegaly with mild diffuse pulmonary interstitial congestion and small right pleural effusion. 2. Associated right basilar opacity favored to reflect atelectasis, although infiltrate could be considered in the correct clinical setting. Electronically Signed   By: Jeannine Boga M.D.   On: 11/14/2018 22:30        Scheduled Meds: . allopurinol  300 mg Oral Daily  . amLODipine  10 mg Oral Daily  . calcitRIOL  0.25 mcg Oral Daily  . donepezil  10 mg Oral QHS  . finasteride  5 mg Oral Daily  . fluticasone furoate-vilanterol  1 puff Inhalation Daily   And  .  umeclidinium bromide  1 puff Inhalation Daily  . gabapentin  100 mg Oral BID  . insulin aspart  0-15 Units Subcutaneous TID WC  . insulin aspart  0-5 Units Subcutaneous QHS  . insulin glargine  15 Units Subcutaneous Daily  . montelukast  10 mg Oral QHS  . oxyCODONE  15 mg Oral Q12H  . pantoprazole  40 mg Oral Daily  . Rivaroxaban  15 mg Oral Q supper  . sodium chloride flush  3 mL Intravenous Q12H  . tamsulosin  0.4 mg Oral Daily   Continuous Infusions: . sodium chloride Stopped (11/15/18 0331)  . aztreonam 1 g (11/15/18 1410)  . metronidazole 500 mg (11/15/18 0906)  . vancomycin       LOS: 0 days    Time spent: 35 minutes.     Elmarie Shiley, MD [facility]  Password [facility] 11/15/2018, 4:27 PM

## 2018-11-15 NOTE — Progress Notes (Signed)
Pharmacy Antibiotic Note  Jeffery [name] is a 78 y.o. male admitted on 11/14/2018 with sepsis.  Pharmacy has been consulted for Vancomycin/Aztreonam dosing. WBC is markedly elevated. Noted renal dysfunction.   Plan: Vancomycin 750 mg IV q24h >>Estimated AUC: 492 Aztreonam 1g IV q8h Flagyl per MD Trend WBC, temp, renal function  F/U infectious work-up Drug levels as indicated   Height: 5\' 8"  (172.7 cm) Weight: 180 lb (81.6 kg) IBW/kg (Calculated) : 68.4  Temp (24hrs), Avg:100.7 F (38.2 C), Min:100.7 F (38.2 C), Max:100.7 F (38.2 C)  Recent Labs  Lab 11/14/18 2211 11/14/18 2234  WBC 30.4*  --   CREATININE 2.31*  --   LATICACIDVEN  --  1.7    Estimated Creatinine Clearance: 25.9 mL/min (A) (by C-G formula based on SCr of 2.31 mg/dL (H)).    Allergies  Allergen Reactions  . Betadine [Povidone Iodine] Rash  . Keflex [Cephalexin] Rash  . Penicillins Rash    Narda Bonds 11/15/2018 1:37 AM

## 2018-11-15 NOTE — Progress Notes (Signed)
RT went into pt room for first rooms and asked if he was interested in wearing a cpap, pt stated he wanted to see the machine and mask we have available for him. RT took machine and mask up to pt room and explained how cpap worked. Pt stated he was willing to try to wear it tonight but was not ready to be placed on. RT told pt to let RN know when he was ready and they will call RT. RT will continue to monitor as needed.

## 2018-11-15 NOTE — Telephone Encounter (Signed)
We will catch up with the infusions later when he is out of the hospital.   Salvatore Marvel, MD [facility]

## 2018-11-15 NOTE — H&P (Addendum)
History and Physical    Jeffery [name] NOM:[id number] DOB: 02/14/1941 DOA: 11/14/2018  PCP: Rosina Lowenstein, FNP   Patient coming from: ALF   Chief Complaint: Fever, chills, fatigue    HPI: Jeffery [name] is a 78 y.o. male with medical history significant for dementia, IgG deficiency, COPD, chronic diastolic CHF, insulin-dependent diabetes mellitus, chronic pain, paroxysmal atrial fibrillation on Xarelto, and chronic kidney disease stage III-IV, now presenting to the emergency department for evaluation of fevers, chills, and fatigue despite outpatient treatment with azithromycin.  Patient developed fevers and chills with fatigue and malaise approximately a week ago, saw his PCP, reports that he was told he was wheezing at that time, was diagnosed with bronchitis, and started on azithromycin.  He felt as though he was improving some before worsening again over the past day or so with persistent fevers and chills, general malaise, and fatigue.  He denies any cough or shortness of breath.  He denies rhinorrhea or sore throat.  He denies any significant abdominal pain, nausea, vomiting, or diarrhea.  Patient denies dysuria or flank pain.  He has a small ulcer on his right great toe without any drainage or surrounding redness.  No neck stiffness or headache.  ED Course: Upon arrival to the ED, patient is found to be febrile to 38.2 C, saturating low 90s on room air, slightly tachypneic, and with vitals otherwise stable.  EKG features a sinus rhythm with PVCs and chest x-ray is notable for cardiomegaly, mild diffuse pulmonary interstitial congestion, and right basilar opacity which could reflect a pneumonia or atelectasis.  Chemistry panel is notable for creatinine of 2.31, within the range of recent priors.  Influenza PCR is negative.  CBC is notable for leukocytosis to 30,400 and a mild normocytic anemia.  Lactic acid is reassuringly normal.  Blood cultures were collected and the patient was treated  with vancomycin and Levaquin in the ED.  He remains hemodynamically stable and will be admitted for further evaluation and management.  Review of Systems:  All other systems reviewed and apart from HPI, are negative.  Past Medical History:  Diagnosis Date  . Angio-edema   . BPH (benign prostatic hyperplasia)   . Cognitive deficits   . COPD (chronic obstructive pulmonary disease) ([place])   . Degeneration of lumbar intervertebral disc   . Diabetes mellitus with neuropathy ([place])   . DVT of axillary vein, chronic, left ([place]) 2017   DVT related to PICC line -PICC line was then placed for long-term IV antibiotics in the setting of osteomyelitis.  Marland Kitchen History of syncope    Hospitalized December 2018. --> EVENT MONITOR: Sinus rhythm with sinus bradycardia, rate lowest 46 bpm.  Minimal PVCs.  Multiple PACs.  No atrial fibrillation.  No arrhythmias.  No indication for pacemaker..  Ambulatory BP recording also done - see HTN  . Hypertension    LABILE: Ambulatory BP monitor 01/2018: Average blood pressure 147/71 mmHg.  Max 180/86 mmHg.  Minimum 106/50 mmHg.  85% of the time, elevated readings noted.  At that time losartan was increased to 100 mg.  . Immunodeficiency syndrome ([place])   . Kidney disease   . Metabolic syndrome    Diabetes, hypertension, obesity  . Obesity (BMI 30.0-34.9)    BMI of 32.8  . Osteoarthritis   . Osteomyelitis ([place]) 2017   Toe  . Paroxysmal atrial fibrillation --by report, NOT confirmed    This was per patient report, but reviewing his prior cardiologist's notes did not comment anything about atrial fibrillation.  Marland Kitchen  Pituitary neoplasm   . S/P splenectomy   . 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 Sleep apnea    On home oxygen, but not CPAP.  Is pending pulmonary medicine referral  . Venous insufficiency (chronic) (peripheral)     Past Surgical History:  Procedure Laterality Date  . APPENDECTOMY    . GALLBLADDER SURGERY    . HERNIA REPAIR    . LUMBAR DISC SURGERY    .  NM MYOVIEW LTD  09/2017   [facility] [place]) -> scanned into Epic: a) September 2016: Normal LV function.  No ischemia or infarction.;; b) January 2019: -EF 65%.  No ischemia or infarction.  LOW RISK.  Mild inferior defect, likely diaphragmatic attenuation  . PAIN PUMP IMPLANTATION    . SPLENECTOMY, TOTAL    . TEMPORAL ARTERY BIOPSY / LIGATION    . TONSILLECTOMY    . TRANSTHORACIC ECHOCARDIOGRAM  09/2017   [facility] [place]) -> scanned into Epic:  a) 05/2015: EF 55 to 60%.  GR 1 DD.  Severe LA dilation.  Aortic sclerosis but no stenosis.;; b) Mild LVH.  EF 55-60%.  GR 1 DD.  Trace mitral and tricuspid regurgitation.  Normal RA pressures.     reports that he quit smoking about 30 years ago. He has never used smokeless tobacco. He reports that he does not drink alcohol or use drugs.  Allergies  Allergen Reactions  . Betadine [Povidone Iodine] Rash  . Keflex [Cephalexin] Rash  . Penicillins Rash    Family History  Problem Relation Age of Onset  . Diabetes Brother      Prior to Admission medications   Medication Sig Start Date End Date Taking? Authorizing Provider  allopurinol (ZYLOPRIM) 300 MG tablet Take 300 mg by mouth daily.  05/10/18  Yes [provider]  amLODipine (NORVASC) 10 MG tablet Take 10 mg by mouth daily.  04/08/18  Yes [provider]  calcitRIOL (ROCALTROL) 0.25 MCG capsule Take 0.25 mcg by mouth daily.  03/31/18  Yes [provider]  donepezil (ARICEPT) 10 MG tablet Take 10 mg by mouth at bedtime.  04/23/18  Yes [provider]  finasteride (PROSCAR) 5 MG tablet Take 5 mg by mouth daily.  03/09/18  Yes [provider]  Fluticasone-Umeclidin-Vilant (TRELEGY ELLIPTA IN) Inhale 2 puffs into the lungs daily.    Yes [provider]  furosemide (LASIX) 20 MG tablet TAKE 20 MG TABLET EVERY DAY , MAY TAKE AN EXTRA 20 MG FOR INCREASE  SWELLING Patient taking  differently: Take 20 mg by mouth daily.  10/03/18  Yes Leonie Man, MD  gabapentin (NEURONTIN) 300 MG capsule Take 300 mg by mouth 3 (three) times daily.  05/29/18  Yes [provider]  GAMMAGARD 20 GM/200ML SOLN Inject into the vein every 30 (thirty) days.  05/01/18  Yes [provider]  Insulin Glargine (LANTUS SOLOSTAR) 100 UNIT/ML Solostar Pen Inject 25 Units into the skin every morning. Patient taking differently: Inject 25 Units into the skin daily.  09/27/18  Yes Renato Shin, MD  insulin lispro (HUMALOG KWIKPEN) 100 UNIT/ML KwikPen Inject 0.04 mLs (4 Units total) into the skin 3 (three) times daily with meals. And pen needles 4/day 09/27/18  Yes Renato Shin, MD  losartan (COZAAR) 100 MG tablet Take 100 mg by mouth daily.  04/21/18  Yes [provider]  montelukast (SINGULAIR) 10 MG tablet Take 10 mg by mouth at bedtime.   Yes  [provider]  mupirocin ointment (BACTROBAN) 2 % APPLY TO WOUND TWICE DAILY AFTER SOAKING Patient taking differently: Apply 1 application topically 2 (two) times daily.  10/17/18  Yes Hyatt, Max T, DPM  omeprazole (PRILOSEC) 10 MG capsule Take by mouth daily.    Yes [provider]  oxyCODONE (ROXICODONE) 15 MG immediate release tablet Take 15 mg by mouth every 6 (six) hours as needed for pain.   Yes [provider]  OXYCONTIN 15 MG 12 hr tablet Take 15 mg by mouth every 12 (twelve) hours.  05/10/18  Yes [provider]  tamsulosin (FLOMAX) 0.4 MG CAPS capsule Take 0.4 mg by mouth daily.  04/07/18  Yes [provider]  XARELTO 15 MG TABS tablet Take 15 mg by mouth daily with supper.  04/07/18  Yes [provider]    Physical Exam: Vitals:   11/14/18 2315 11/14/18 2330 11/15/18 0016 11/15/18 0030  BP: (!) 161/73 (!) 145/67 118/74 (!) 144/69  Pulse: 96 83 78 79  Resp: 18 (!) 27 16 (!) 23  Temp:      TempSrc:      SpO2: 96% 97% 98% 97%  Weight:      Height:        Constitutional:  NAD, calm  Eyes: PERTLA, lids and conjunctivae normal ENMT: Mucous membranes are moist. Posterior pharynx clear of any exudate or lesions.   Neck: normal, supple, no masses, no thyromegaly Respiratory: no wheezing, no crackles. Normal respiratory effort.   Cardiovascular: S1 & S2 heard, regular rate and rhythm. No extremity edema.   Abdomen: No distension, no tenderness, soft. Bowel sounds normal.  Musculoskeletal: no clubbing / cyanosis. No joint deformity upper and lower extremities.   Skin: Small ulcer at right great toe without drainage or surrounding erythema. Warm, dry, well-perfused. Neurologic: CN 2-12 grossly intact. Sensation intact. Strength 5/5 in all 4 limbs.  Psychiatric: Alert and oriented to person, place, and situation. Pleasant and cooperative.    Labs on Admission: I have personally reviewed following labs and imaging studies  CBC: Recent Labs  Lab 11/14/18 2211  WBC 30.4*  NEUTROABS 26.2*  HGB 11.4*  HCT 35.3*  MCV 95.9  PLT 240   Basic Metabolic Panel: Recent Labs  Lab 11/14/18 2211  NA 135  K 4.4  CL 103  CO2 22  GLUCOSE 189*  BUN 34*  CREATININE 2.31*  CALCIUM 9.0   GFR: Estimated Creatinine Clearance: 25.9 mL/min (A) (by C-G formula based on SCr of 2.31 mg/dL (H)). Liver Function Tests: Recent Labs  Lab 11/14/18 2211  AST 22  ALT 18  ALKPHOS 81  BILITOT 0.6  PROT 6.6  ALBUMIN 2.7*   No results for input(s): LIPASE, AMYLASE in the last 168 hours. No results for input(s): AMMONIA in the last 168 hours. Coagulation Profile: No results for input(s): INR, PROTIME in the last 168 hours. Cardiac Enzymes: No results for input(s): CKTOTAL, CKMB, CKMBINDEX, TROPONINI in the last 168 hours. BNP (last 3 results) No results for input(s): PROBNP in the last 8760 hours. HbA1C: No results for input(s): HGBA1C in the last 72 hours. CBG: No results for input(s): GLUCAP in the last 168 hours. Lipid Profile: No results for input(s): CHOL, HDL,  LDLCALC, TRIG, CHOLHDL, LDLDIRECT in the last 72 hours. Thyroid Function Tests: No results for input(s): TSH, T4TOTAL, FREET4, T3FREE, THYROIDAB in the last 72 hours. Anemia Panel: No results for input(s): VITAMINB12, FOLATE, FERRITIN, TIBC, IRON, RETICCTPCT in the last 72 hours. Urine analysis:  Component Value Date/Time   COLORURINE YELLOW 11/14/2018 2320   APPEARANCEUR CLEAR 11/14/2018 2320   LABSPEC 1.014 11/14/2018 2320   PHURINE 5.0 11/14/2018 2320   GLUCOSEU 50 (A) 11/14/2018 2320   HGBUR NEGATIVE 11/14/2018 2320   BILIRUBINUR NEGATIVE 11/14/2018 2320   KETONESUR NEGATIVE 11/14/2018 2320   PROTEINUR 100 (A) 11/14/2018 2320   NITRITE NEGATIVE 11/14/2018 2320   LEUKOCYTESUR NEGATIVE 11/14/2018 2320   Sepsis Labs: @LABRCNTIP (procalcitonin:4,lacticidven:4) )No results found for this or any previous visit (from the past 240 hour(s)).   Radiological Exams on Admission: Dg Chest 2 View  Result Date: 11/14/2018 CLINICAL DATA:  Initial evaluation for acute fever, flu symptoms. EXAM: CHEST - 2 VIEW COMPARISON:  Prior radiograph from 09/24/2018. FINDINGS: Patient is markedly rotated to the right. Cardiomegaly grossly stable. Mediastinal silhouette within normal limits. Lungs mildly hypoinflated. Mild diffuse interstitial congestion without frank pulmonary edema. Superimposed confluent right basilar opacity could reflect atelectasis or infiltrate. Small right pleural effusion. No pneumothorax. No acute osseous finding. IMPRESSION: 1. Cardiomegaly with mild diffuse pulmonary interstitial congestion and small right pleural effusion. 2. Associated right basilar opacity favored to reflect atelectasis, although infiltrate could be considered in the correct clinical setting. Electronically Signed   By: Jeannine Boga M.D.   On: 11/14/2018 22:30    EKG: Independently reviewed. Sinus rhythm, PVC's.   Assessment/Plan  1. Sepsis, likely secondary to PNA  - Presents with fevers, chills,  fatigue, and general malaise despite recently completing a course of azithromycin  - There is no urinary sxs, abdominal pain or GI complaints, meningismus, or upper respiratory sxs; there is a toe ulcer that does not appear acutely infected   - Possible PNA noted on CXR and this seems to be most likely source, but patient denies SOB or cough  - Blood cultures were collected in ED and he was treated with empiric Levaquin and vancomycin  - Broaden antibiotic coverage for now given unclear source and underlying IgG deficiency, check procalcitonin, sputum culture, and strep pneumo antigen, and follow cultures and clinical course    2. COPD  - No wheezing or cough on admission  - Continue Trelegy, prn albuterol    3. Chronic diastolic CHF  - Appears compensated  - Hold Lasix initially while treated sepsis, hold losartan initially given unclear baseline renal function, follow daily weights    4. CKD stage III-IV  - SCr is 2.31 on admission; has been 1.8-2.8 over the past several months  - Renally-dose medications, avoid nephrotoxins   5. Chronic pain  - Stable  - Continue gabapentin, long-acting oxycodone, and as-needed Oxy IR    6. Insulin-dependent DM  - A1c was 6.8% last month  - Managed at home with Lantus and Humalog, will continue Lantus with [facility] correction[facility] while in hospital    DVT prophylaxis: Xarelto  Code Status: Full  Family Communication: Discussed with patient  Consults called: None  Admission status: Inpatient; PORT/PSI score is 127, patient has already failed outpatient treatment, and admission is indicated d/t high risk for life-threatening complications    Vianne Bulls, MD [facility] Pager [phone]  If 7PM-7AM, please contact night-coverage [email or web address] Password [facility]  11/15/2018, 12:41 AM

## 2018-11-16 ENCOUNTER — Other Ambulatory Visit: Payer: Self-pay

## 2018-11-16 ENCOUNTER — Telehealth: Payer: Self-pay | Admitting: Allergy & Immunology

## 2018-11-16 LAB — GLUCOSE, CAPILLARY
Glucose-Capillary: 161 mg/dL — ABNORMAL HIGH (ref 70–99)
Glucose-Capillary: 217 mg/dL — ABNORMAL HIGH (ref 70–99)
Glucose-Capillary: 173 mg/dL — ABNORMAL HIGH (ref 70–99)
Glucose-Capillary: 133 mg/dL — ABNORMAL HIGH (ref 70–99)

## 2018-11-16 LAB — CBC
HEMATOCRIT: 39.7 % (ref 39.0–52.0)
Hemoglobin: 13.1 g/dL (ref 13.0–17.0)
MCH: 31.5 pg (ref 26.0–34.0)
MCHC: 33 g/dL (ref 30.0–36.0)
MCV: 95.4 fL (ref 80.0–100.0)
Platelets: 285 10*3/uL (ref 150–400)
RBC: 4.16 MIL/uL — ABNORMAL LOW (ref 4.22–5.81)
RDW: 15.9 % — ABNORMAL HIGH (ref 11.5–15.5)
WBC: 12.8 10*3/uL — ABNORMAL HIGH (ref 4.0–10.5)
nRBC: 0 % (ref 0.0–0.2)

## 2018-11-16 LAB — BASIC METABOLIC PANEL
Anion gap: 10 (ref 5–15)
BUN: 37 mg/dL — ABNORMAL HIGH (ref 8–23)
CHLORIDE: 108 mmol/L (ref 98–111)
CO2: 22 mmol/L (ref 22–32)
Calcium: 9.4 mg/dL (ref 8.9–10.3)
Creatinine, Ser: 2.12 mg/dL — ABNORMAL HIGH (ref 0.61–1.24)
GFR calc Af Amer: 34 mL/min — ABNORMAL LOW (ref >60)
GFR calc non Af Amer: 29 mL/min — ABNORMAL LOW (ref >60)
GLUCOSE: 117 mg/dL — AB (ref 70–99)
Potassium: 4.1 mmol/L (ref 3.5–5.1)
Sodium: 140 mmol/L (ref 135–145)

## 2018-11-16 MED ORDER — FUROSEMIDE 20 MG PO TABS
20.0000 mg | ORAL_TABLET | Freq: Every day | ORAL | Status: DC
  Administered 2018-11-17: 20 mg via ORAL
  Filled 2018-11-16: qty 1

## 2018-11-16 NOTE — Progress Notes (Signed)
PROGRESS NOTE    Jeffery [name]  CWU:[id number] DOB: 1940/12/14 DOA: 11/14/2018 PCP: Rosina Lowenstein, FNP    Brief Narrative: Old with past medical history significant for dementia, IgG deficiency, COPD, CAD HF diastolic, diabetes paroxysmal A. fib on Xarelto chronic kidney disease a stage III/IV who presents complaining of fevers chills.  He finished a course of azithromycin.  Evaluation in the ED he was found to have pneumonia with a white count of 38.  Assessment & Plan:   Principal Problem:   Sepsis ([place]) Active Problems:   Chronic obstructive pulmonary disease   OSA (obstructive sleep apnea)   Essential hypertension   Chronic diastolic (congestive) heart failure (HCC)   PAF (paroxysmal atrial fibrillation) (HCC)   CKD (chronic kidney disease), stage IV ([place])   Insulin-requiring or dependent type II diabetes mellitus (HCC)   Chronic pain   1-Sepsis likely secondary to pneumonia: Stop IV fluids.  MRSA PCR negative will discontinue vancomycin.  Continue with aztreonam. WBC trending down. He is not feeling at baseline for breathing. Will keep one more day of IV antibiotics.   COPD continue with Trelegy, PRN albuterol.  Chronic diastolic heart failure: Hold losartan/ resume lasix.   Chronic kidney disease a stage III/IV.  Creatinine on admission at 2.3.  Creatinine baseline 1.8-2.8. Monitor renal function daily.  Chronic pain, continue with gabapentin, oxycodone.  Diabetes treated with Lantus and a sliding scale.    Estimated body mass index is 27.37 kg/m as calculated from the following:   Height as of this encounter: 5\' 8"  (1.727 m).   Weight as of this encounter: 81.6 kg.   DVT prophylaxis: On Xarelto Code Status: Full Code Family Communication: Care discussed with wife over the phone Disposition Plan: Continue with IV antibiotics, white count significantly elevated  Consultants:   None      Antimicrobials: Aztreonam   Subjective: Not feeling at  baseline.  Report Cough, dyspnea  Objective: Vitals:   11/16/18 0416 11/16/18 0739 11/16/18 0836 11/16/18 1135  BP: (!) 147/64 (!) 151/68  (!) 155/67  Pulse: 82 68  76  Resp: 17 16  17   Temp: 98.1 F (36.7 C) 98.3 F (36.8 C)  97.8 F (36.6 C)  TempSrc: Oral Oral  Oral  SpO2: 100% 97% 96% 100%  Weight:      Height:        Intake/Output Summary (Last 24 hours) at 11/16/2018 1540 Last data filed at 11/16/2018 0200 Gross per 24 hour  Intake 920.36 ml  Output -  Net 920.36 ml   Filed Weights   11/14/18 2239  Weight: 81.6 kg    Examination:  General exam: NAD Respiratory system: Bilateral crackles.  Cardiovascular system: S 1, S 2 RRR Gastrointestinal system: BS present, soft, nt Central nervous system: alert, non focal.  Extremities: symmetric power.  Skin: No rash  Data Reviewed: I have personally reviewed following labs and imaging studies  CBC: Recent Labs  Lab 11/14/18 2211 11/15/18 0453 11/16/18 0812  WBC 30.4* 28.8* 12.8*  NEUTROABS 26.2* 24.6*  --   HGB 11.4* 10.4* 13.1  HCT 35.3* 32.3* 39.7  MCV 95.9 95.6 95.4  PLT 276 244 388   Basic Metabolic Panel: Recent Labs  Lab 11/14/18 2211 11/15/18 0453 11/16/18 0812  NA 135 137 140  K 4.4 4.7 4.1  CL 103 104 108  CO2 22 24 22   GLUCOSE 189* 151* 117*  BUN 34* 34* 37*  CREATININE 2.31* 2.18* 2.12*  CALCIUM 9.0 8.6* 9.4   GFR:  Estimated Creatinine Clearance: 28.2 mL/min (A) (by C-G formula based on SCr of 2.12 mg/dL (H)). Liver Function Tests: Recent Labs  Lab 11/14/18 2211  AST 22  ALT 18  ALKPHOS 81  BILITOT 0.6  PROT 6.6  ALBUMIN 2.7*   No results for input(s): LIPASE, AMYLASE in the last 168 hours. No results for input(s): AMMONIA in the last 168 hours. Coagulation Profile: No results for input(s): INR, PROTIME in the last 168 hours. Cardiac Enzymes: No results for input(s): CKTOTAL, CKMB, CKMBINDEX, TROPONINI in the last 168 hours. BNP (last 3 results) No results for input(s):  PROBNP in the last 8760 hours. HbA1C: No results for input(s): HGBA1C in the last 72 hours. CBG: Recent Labs  Lab 11/15/18 1128 11/15/18 1710 11/15/18 2121 11/16/18 0646 11/16/18 1226  GLUCAP 98 198* 129* 161* 217*   Lipid Profile: No results for input(s): CHOL, HDL, LDLCALC, TRIG, CHOLHDL, LDLDIRECT in the last 72 hours. Thyroid Function Tests: No results for input(s): TSH, T4TOTAL, FREET4, T3FREE, THYROIDAB in the last 72 hours. Anemia Panel: No results for input(s): VITAMINB12, FOLATE, FERRITIN, TIBC, IRON, RETICCTPCT in the last 72 hours. Sepsis Labs: Recent Labs  Lab 11/14/18 2234 11/15/18 0453  PROCALCITON  --  0.40  LATICACIDVEN 1.7  --     Recent Results (from the past 240 hour(s))  Blood Culture (routine x 2)     Status: None (Preliminary result)   Collection Time: 11/14/18 10:34 PM  Result Value Ref Range Status   Specimen Description BLOOD LEFT ARM  Final   Special Requests   Final    BOTTLES DRAWN AEROBIC AND ANAEROBIC Blood Culture adequate volume   Culture   Final    NO GROWTH 2 DAYS Performed at [facility], [address] [place], [place] [address]    Report Status PENDING  Incomplete  Blood Culture (routine x 2)     Status: None (Preliminary result)   Collection Time: 11/14/18 11:00 PM  Result Value Ref Range Status   Specimen Description BLOOD RIGHT ARM  Final   Special Requests   Final    BOTTLES DRAWN AEROBIC AND ANAEROBIC Blood Culture results may not be optimal due to an excessive volume of blood received in culture bottles   Culture   Final    NO GROWTH 1 DAY Performed at [facility], [place] [address], [place], [place] [address]    Report Status PENDING  Incomplete  MRSA PCR Screening     Status: None   Collection Time: 11/15/18  3:08 AM  Result Value Ref Range Status   MRSA by PCR NEGATIVE NEGATIVE Final    Comment:        The GeneXpert MRSA Assay (FDA approved for NASAL specimens only), is one component of  a comprehensive MRSA colonization surveillance program. It is not intended to diagnose MRSA infection nor to guide or monitor treatment for MRSA infections. Performed at [facility], [place] [address], [place], [place] [address]          Radiology Studies: Dg Chest 2 View  Result Date: 11/14/2018 CLINICAL DATA:  Initial evaluation for acute fever, flu symptoms. EXAM: CHEST - 2 VIEW COMPARISON:  Prior radiograph from 09/24/2018. FINDINGS: Patient is markedly rotated to the right. Cardiomegaly grossly stable. Mediastinal silhouette within normal limits. Lungs mildly hypoinflated. Mild diffuse interstitial congestion without frank pulmonary edema. Superimposed confluent right basilar opacity could reflect atelectasis or infiltrate. Small right pleural effusion. No pneumothorax. No acute osseous finding.  IMPRESSION: 1. Cardiomegaly with mild diffuse pulmonary interstitial congestion and small right pleural effusion. 2. Associated right basilar opacity favored to reflect atelectasis, although infiltrate could be considered in the correct clinical setting. Electronically Signed   By: Jeannine Boga M.D.   On: 11/14/2018 22:30        Scheduled Meds: . allopurinol  300 mg Oral Daily  . amLODipine  10 mg Oral Daily  . calcitRIOL  0.25 mcg Oral Daily  . donepezil  10 mg Oral QHS  . finasteride  5 mg Oral Daily  . fluticasone furoate-vilanterol  1 puff Inhalation Daily   And  . umeclidinium bromide  1 puff Inhalation Daily  . gabapentin  100 mg Oral BID  . insulin aspart  0-15 Units Subcutaneous TID WC  . insulin aspart  0-5 Units Subcutaneous QHS  . insulin glargine  15 Units Subcutaneous Daily  . montelukast  10 mg Oral QHS  . oxyCODONE  15 mg Oral Q12H  . pantoprazole  40 mg Oral Daily  . Rivaroxaban  15 mg Oral Q supper  . sodium chloride flush  3 mL Intravenous Q12H  . tamsulosin  0.4 mg Oral Daily   Continuous Infusions: . sodium chloride Stopped (11/15/18 1410)  .  aztreonam 1 g (11/16/18 1501)  . metronidazole Stopped (11/16/18 1123)     LOS: 1 day    Time spent: 35 minutes.     Elmarie Shiley, MD [facility]  Password [place] 11/16/2018, 3:40 PM

## 2018-11-16 NOTE — Telephone Encounter (Signed)
Patient's wife called to let you know that Jeffery [name] has been hospitalized with pneumonia; at [facility]. His white count was 30,000. She said he is due for his infusion on March 2 and she doesn't know what you want to do about that.

## 2018-11-16 NOTE — Care Management Note (Signed)
Case Management Note  Patient Details  Name: Jeffery [name] MRN: [id number] Date of Birth: [birth year]  Subjective/Objective:   Pt admitted with sepsis. He is from the [facility] with his spouse.  DME at home: cane, shower bars and seat Wife sets up his medications. No issues with cost. Pt either uses [facility] transport or [facility].                 Action/Plan: Current plan is for d/c home when medically ready. CM following for d/c needs, physician orders. MD please order PT/OT eval if needed prior to d/c.   Expected Discharge Date:                  Expected Discharge Plan:  Home/Self Care  In-House Referral:     Discharge planning Services     Post Acute Care Choice:    Choice offered to:     DME Arranged:    DME Agency:     HH Arranged:    HH Agency:     Status of Service:  In process, will continue to follow  If discussed at Long Length of Stay Meetings, dates discussed:    Additional Comments:  Pollie Friar, RN 11/16/2018, 1:37 PM

## 2018-11-16 NOTE — Progress Notes (Signed)
Chaplain visited with patient, listened to his concerns- he didn't know how sick he was.  He is feeling better and some distress noted as he and wife are preparing to move to living center for people over 43.  He is Nurse, learning disability.  Had individual prayer and then The Lord's Prayer.  He asked about ashes-missed Ash Wed.  Conard Novak, Chaplain   11/16/18 1100  Clinical Encounter Type  Visited With Patient  Visit Type Initial;Spiritual support  Referral From Patient  Consult/Referral To Chaplain  Spiritual Encounters  Spiritual Needs Prayer  Stress Factors  Patient Stress Factors Other (Comment) (getting ready to move, loss of friend, sick)

## 2018-11-16 NOTE — Telephone Encounter (Signed)
I called Frank's wife back to discuss his condition.  She tells me that they were thinking of sending him home today, but it will likely be tomorrow.  I did tell his wife that they could just go ahead and give the IVIG (30gm Privigen) while he is there in the hospital.  That would prevent him from needing to leave his house next Monday for the infusion.  I will send a note to his physician (Dr. Tyrell Antonio) who is managing him in the hospital to keep her in the loop.   His wife also asked about changing from Benadryl to Zyrtec and I told her this was fine.   Salvatore Marvel, MD [facility]

## 2018-11-16 NOTE — Discharge Instructions (Signed)
Information on my medicine - XARELTO (Rivaroxaban)  This medication education was reviewed with me or my healthcare representative as part of my discharge preparation.  You were prescribed this medication prior to this hospital admission.   Why was Xarelto prescribed for you? Xarelto was prescribed for you to reduce the risk of a blood clot forming that can cause a stroke if you have a medical condition called atrial fibrillation (a type of irregular heartbeat).  What do you need to know about xarelto ? Take your Xarelto ONCE DAILY at the same time every day with your evening meal. If you have difficulty swallowing the tablet whole, you may crush it and mix in applesauce just prior to taking your dose.  Take Xarelto exactly as prescribed by your doctor and DO NOT stop taking Xarelto without talking to the doctor who prescribed the medication.  Stopping without other stroke prevention medication to take the place of Xarelto may increase your risk of developing a clot that causes a stroke.  Refill your prescription before you run out.  After discharge, you should have regular check-up appointments with your healthcare provider that is prescribing your Xarelto.  In the future your dose may need to be changed if your kidney function or weight changes by a significant amount.  What do you do if you miss a dose? If you are taking Xarelto ONCE DAILY and you miss a dose, take it as soon as you remember on the same day then continue your regularly scheduled once daily regimen the next day. Do not take two doses of Xarelto at the same time or on the same day.   Important Safety Information A possible side effect of Xarelto is bleeding. You should call your healthcare provider right away if you experience any of the following: ? Bleeding from an injury or your nose that does not stop. ? Unusual colored urine (red or dark brown) or unusual colored stools (red or black). ? Unusual bruising for  unknown reasons. ? A serious fall or if you hit your head (even if there is no bleeding).  Some medicines may interact with Xarelto and might increase your risk of bleeding while on Xarelto. To help avoid this, consult your healthcare provider or pharmacist prior to using any new prescription or non-prescription medications, including herbals, vitamins, non-steroidal anti-inflammatory drugs (NSAIDs) and supplements.  This website has more information on Xarelto: [email or web address]

## 2018-11-17 ENCOUNTER — Other Ambulatory Visit: Payer: Self-pay

## 2018-11-17 ENCOUNTER — Other Ambulatory Visit (HOSPITAL_COMMUNITY): Payer: Self-pay

## 2018-11-17 ENCOUNTER — Inpatient Hospital Stay (HOSPITAL_COMMUNITY): Payer: Medicare Other

## 2018-11-17 LAB — GLUCOSE, CAPILLARY
Glucose-Capillary: 141 mg/dL — ABNORMAL HIGH (ref 70–99)
Glucose-Capillary: 155 mg/dL — ABNORMAL HIGH (ref 70–99)

## 2018-11-17 LAB — CBC
HCT: 31.3 % — ABNORMAL LOW (ref 39.0–52.0)
Hemoglobin: 10 g/dL — ABNORMAL LOW (ref 13.0–17.0)
MCH: 30.4 pg (ref 26.0–34.0)
MCHC: 31.9 g/dL (ref 30.0–36.0)
MCV: 95.1 fL (ref 80.0–100.0)
Platelets: 243 10*3/uL (ref 150–400)
RBC: 3.29 MIL/uL — AB (ref 4.22–5.81)
RDW: 15.9 % — ABNORMAL HIGH (ref 11.5–15.5)
WBC: 10.2 10*3/uL (ref 4.0–10.5)
nRBC: 0.2 % (ref 0.0–0.2)

## 2018-11-17 MED ORDER — LEVOFLOXACIN 500 MG PO TABS: 250.0000 mg | ORAL_TABLET | Freq: Every day | ORAL | Status: DC

## 2018-11-17 MED ORDER — MUPIROCIN 2 % EX OINT
TOPICAL_OINTMENT | Freq: Two times a day (BID) | CUTANEOUS | Status: DC
  Filled 2018-11-17 (×2): qty 22

## 2018-11-17 MED ORDER — LEVOFLOXACIN 250 MG PO TABS: 250.0000 mg | ORAL_TABLET | Freq: Every day | ORAL | 0 refills | Status: AC

## 2018-11-17 MED ORDER — INSULIN GLARGINE 100 UNIT/ML SOLOSTAR PEN: 15.0000 [IU] | PEN_INJECTOR | SUBCUTANEOUS | 11 refills | Status: AC

## 2018-11-17 MED ORDER — LEVOFLOXACIN 500 MG PO TABS
500.0000 mg | ORAL_TABLET | Freq: Every day | ORAL | Status: AC
  Administered 2018-11-17: 500 mg via ORAL
  Filled 2018-11-17: qty 1

## 2018-11-17 NOTE — Discharge Summary (Signed)
Physician Discharge Summary  Jeffery [name] WIO:[id number] DOB: 08/09/1941 DOA: 11/14/2018  PCP: Rosina Lowenstein, FNP  Admit date: 11/14/2018 Discharge date: 11/17/2018  Admitted From: Home  Disposition:  Home   Recommendations for Outpatient Follow-up:  1. Follow up with PCP in 1-2 weeks 2. Please obtain BMP/CBC in one week 3. Needs follow up US for superficial thrombosis, and claudication, vascular evaluation.    Discharge Condition: stable.  CODE STATUS: full code Diet recommendation: Heart Healthy  Brief/Interim Summary: Old with past medical history significant for dementia, IgG deficiency, COPD, CAD HF diastolic, diabetes paroxysmal A. fib on Xarelto chronic kidney disease a stage III/IV who presents complaining of fevers chills.  He finished a course of azithromycin.  Evaluation in the ED he was found to have pneumonia with a white count of 38.  1-Sepsis likely secondary to pneumonia: Stop IV fluids.  MRSA PCR negative will discontinue vancomycin.  Continue with aztreonam. WBC normalized. He received 3 days antibiotics in the hospital. Will prescribe levaquin 3 more days.   COPD continue with Trelegy, PRN albuterol.  Chronic diastolic heart failure: resume  losartan/ resume lasix.   Chronic kidney disease a stage III/IV.  Creatinine on admission at 2.3.  Creatinine baseline 1.8-2.8. Monitor renal function daily. stable.   Chronic pain, continue with gabapentin, oxycodone.  Diabetes treated with Lantus and a sliding scale.  Left toe callus; x ary negative for infection. Needs to follow up with out patient podiatric.   IGG; discussed with pharmacy, IgG is not cover inpatient.   Discharge Diagnoses:  Principal Problem:   Sepsis ([place]) Active Problems:   Chronic obstructive pulmonary disease   OSA (obstructive sleep apnea)   Essential hypertension   Chronic diastolic (congestive) heart failure (HCC)   PAF (paroxysmal atrial fibrillation) (HCC)   CKD  (chronic kidney disease), stage IV ([place])   Insulin-requiring or dependent type II diabetes mellitus (HCC)   Chronic pain    Discharge Instructions  Discharge Instructions    Diet - low sodium heart healthy   Complete by:  As directed    Increase activity slowly   Complete by:  As directed      Allergies as of 11/17/2018      Reactions   Betadine [povidone Iodine] Rash   Keflex [cephalexin] Rash   Penicillins Rash      Medication List    TAKE these medications   allopurinol 300 MG tablet Commonly known as:  ZYLOPRIM Take 300 mg by mouth daily.   amLODipine 10 MG tablet Commonly known as:  NORVASC Take 10 mg by mouth daily.   calcitRIOL 0.25 MCG capsule Commonly known as:  ROCALTROL Take 0.25 mcg by mouth daily.   donepezil 10 MG tablet Commonly known as:  ARICEPT Take 10 mg by mouth at bedtime.   finasteride 5 MG tablet Commonly known as:  PROSCAR Take 5 mg by mouth daily.   furosemide 20 MG tablet Commonly known as:  LASIX TAKE 20 MG TABLET EVERY DAY , MAY TAKE AN EXTRA 20 MG FOR INCREASE  SWELLING What changed:    how much to take  how to take this  when to take this  additional instructions   gabapentin 300 MG capsule Commonly known as:  NEURONTIN Take 300 mg by mouth 3 (three) times daily.   GAMMAGARD 20 GM/200ML Soln Generic drug:  Immune Globulin (Human) Inject into the vein every 30 (thirty) days.   Insulin Glargine 100 UNIT/ML Solostar Pen Commonly known as:  LANTUS SOLOSTAR Inject  15 Units into the skin every morning. What changed:  how much to take   insulin lispro 100 UNIT/ML KwikPen Commonly known as:  HUMALOG KWIKPEN Inject 0.04 mLs (4 Units total) into the skin 3 (three) times daily with meals. And pen needles 4/day   levofloxacin 250 MG tablet Commonly known as:  LEVAQUIN Take 1 tablet (250 mg total) by mouth daily. Start taking on:  November 18, 2018   losartan 100 MG tablet Commonly known as:  COZAAR Take 100 mg by mouth  daily.   montelukast 10 MG tablet Commonly known as:  SINGULAIR Take 10 mg by mouth at bedtime.   mupirocin ointment 2 % Commonly known as:  BACTROBAN APPLY TO WOUND TWICE DAILY AFTER SOAKING What changed:    how much to take  how to take this  when to take this  additional instructions   omeprazole 10 MG capsule Commonly known as:  PRILOSEC Take by mouth daily.   oxyCODONE 15 MG immediate release tablet Commonly known as:  ROXICODONE Take 15 mg by mouth every 6 (six) hours as needed for pain.   OXYCONTIN 15 mg 12 hr tablet Generic drug:  oxyCODONE Take 15 mg by mouth every 12 (twelve) hours.   tamsulosin 0.4 MG Caps capsule Commonly known as:  FLOMAX Take 0.4 mg by mouth daily.   TRELEGY ELLIPTA IN Inhale 2 puffs into the lungs daily.   XARELTO 15 MG Tabs tablet Generic drug:  Rivaroxaban Take 15 mg by mouth daily with supper.       Allergies  Allergen Reactions  . Betadine [Povidone Iodine] Rash  . Keflex [Cephalexin] Rash  . Penicillins Rash    Consultations: none  Procedures/Studies: Dg Chest 2 View  Result Date: 11/14/2018 CLINICAL DATA:  Initial evaluation for acute fever, flu symptoms. EXAM: CHEST - 2 VIEW COMPARISON:  Prior radiograph from 09/24/2018. FINDINGS: Patient is markedly rotated to the right. Cardiomegaly grossly stable. Mediastinal silhouette within normal limits. Lungs mildly hypoinflated. Mild diffuse interstitial congestion without frank pulmonary edema. Superimposed confluent right basilar opacity could reflect atelectasis or infiltrate. Small right pleural effusion. No pneumothorax. No acute osseous finding. IMPRESSION: 1. Cardiomegaly with mild diffuse pulmonary interstitial congestion and small right pleural effusion. 2. Associated right basilar opacity favored to reflect atelectasis, although infiltrate could be considered in the correct clinical setting. Electronically Signed   By: Jeannine Boga M.D.   On: 11/14/2018 22:30    Dg Foot Complete Right  Result Date: 11/17/2018 CLINICAL DATA:  Right foot pain and swelling, initial encounter EXAM: RIGHT FOOT COMPLETE - 3+ VIEW COMPARISON:  None. FINDINGS: Tarsal degenerative changes are noted. No acute fracture or dislocation is noted. No gross soft tissue abnormality is seen. IMPRESSION: No acute abnormality noted. Electronically Signed   By: Inez Catalina M.D.   On: 11/17/2018 12:05   Vas [place] Lower Extremity Venous Reflux  Result Date: 10/31/2018  Lower Venous Study Other Indications: In 05/2018, a bilateral lower extremity duplex performed at                    [place] showed no evidence of thrombus in the deep                    and superficial veins. Patient presents today with increase                    swelling and edema of the lower extremities. He has a history  of DVT of the axillary vein per office note on 10/03/2018. Anticoagulation: Xarelto.  Performing Technologist: Sharlett Iles RVT  Examination Guidelines: A complete evaluation includes B-mode imaging, spectral Doppler, color Doppler, and power Doppler as needed of all accessible portions of each vessel. Bilateral testing is considered an integral part of a complete examination. Limited examinations for reoccurring indications may be performed as noted.  Right Venous Findings: +---------+---------------+---------+-----------+------------------+-------+          CompressibilityPhasicitySpontaneityProperties        Summary +---------+---------------+---------+-----------+------------------+-------+ CFV      Full           Yes      Yes                                  +---------+---------------+---------+-----------+------------------+-------+ SFJ      Full           Yes      Yes                                  +---------+---------------+---------+-----------+------------------+-------+ FV Prox  Full           Yes      Yes                                   +---------+---------------+---------+-----------+------------------+-------+ FV Mid   Full                                                         +---------+---------------+---------+-----------+------------------+-------+ FV DistalFull           Yes      Yes                                  +---------+---------------+---------+-----------+------------------+-------+ PFV      Full           Yes      Yes                                  +---------+---------------+---------+-----------+------------------+-------+ POP      Full           Yes      Yes                                  +---------+---------------+---------+-----------+------------------+-------+ PTV      Full           Yes      Yes                                  +---------+---------------+---------+-----------+------------------+-------+ PERO     Full           Yes      No                                   +---------+---------------+---------+-----------+------------------+-------+  Gastroc  Full                                                         +---------+---------------+---------+-----------+------------------+-------+ GSV      Partial        Yes      Yes        softly echogenic          +---------+---------------+---------+-----------+------------------+-------+ SSV      Partial        Yes      No         brightly echogenic        +---------+---------------+---------+-----------+------------------+-------+  Right Technical Findings: The GSV appears partially compressible from the level of the mid calf; acute, non occlusive, thrombus noted. Dilatation noted of the GSV at mid calf. The SSV appears partially compressible from the level of the mid calf; chronic, non occlusive, thrombus noted.  Left Venous Findings: +---------+---------------+---------+-----------+----------+-------+          CompressibilityPhasicitySpontaneityPropertiesSummary  +---------+---------------+---------+-----------+----------+-------+ CFV      Full           Yes      Yes                          +---------+---------------+---------+-----------+----------+-------+ SFJ      Full           Yes      Yes                          +---------+---------------+---------+-----------+----------+-------+ FV Prox  Full           Yes      Yes                          +---------+---------------+---------+-----------+----------+-------+ FV Mid   Full                                                 +---------+---------------+---------+-----------+----------+-------+ FV DistalFull           Yes      Yes                          +---------+---------------+---------+-----------+----------+-------+ PFV      Full           Yes      Yes                          +---------+---------------+---------+-----------+----------+-------+ POP      Full           Yes      Yes                          +---------+---------------+---------+-----------+----------+-------+ PTV      Full           Yes      Yes                          +---------+---------------+---------+-----------+----------+-------+ PERO  Full           Yes      No                           +---------+---------------+---------+-----------+----------+-------+ Gastroc  Full                                                 +---------+---------------+---------+-----------+----------+-------+ GSV      Full           Yes      Yes                          +---------+---------------+---------+-----------+----------+-------+  Vein Diameters: +------------------------------+----------+---------+                               Right (cm)Left (cm) +------------------------------+----------+---------+ GSV at Saphenofemoral junction.60       .74       +------------------------------+----------+---------+ GSV at prox thigh             .54       .51        +------------------------------+----------+---------+ GSV at mid thigh              .47       .58       +------------------------------+----------+---------+ GSV at distal thigh           .40       .54       +------------------------------+----------+---------+ GSV at knee                   .36       .45       +------------------------------+----------+---------+ GSV prox calf                 .34       .45       +------------------------------+----------+---------+ GSV mid calf                  .72       .51       +------------------------------+----------+---------+ SSV prox                      .42       .62       +------------------------------+----------+---------+ SSV mid                       .37       .32       +------------------------------+----------+---------+   Summary: Right: No evidence of deep vein thrombosis in the lower extremity. No indirect evidence of obstruction proximal to the inguinal ligament. Abnormal reflux times were noted in the great saphenous vein at the mid thigh. Findings consistent with acute superficial vein thrombosis involving the right great saphenous vein at level of the mid calf . 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 Left: No evidence of deep vein thrombosis in the lower extremity. No indirect evidence of obstruction proximal to the inguinal ligament. Abnormal reflux times were noted in the great saphenous vein at the mid thigh, great saphenous vein at the distal thigh, great saphenous vein  at the knee, great saphenous vein at the proximal calf, and great saphenous vein at the mid calf.  *See table(s) above for measurements and observations. Electronically signed by Larae Grooms MD on 10/31/2018 at 12:32:36 PM.    Final       Subjective: Feeling well. No cough   Discharge Exam: Vitals:   11/17/18 0812 11/17/18 1147  BP:  (!) 142/72  Pulse:  81   Resp:  16  Temp:  97.7 F (36.5 C)  SpO2: 98% 99%     General: Pt is alert, awake, not in acute distress Cardiovascular: RRR, S1/S2 +, no rubs, no gallops Respiratory: CTA bilaterally, no wheezing, no rhonchi Abdominal: Soft, NT, ND, bowel sounds + Extremities: no edema, no cyanosis    The results of significant diagnostics from this hospitalization (including imaging, microbiology, ancillary and laboratory) are listed below for reference.     Microbiology: Recent Results (from the past 240 hour(s))  Blood Culture (routine x 2)     Status: None (Preliminary result)   Collection Time: 11/14/18 10:34 PM  Result Value Ref Range Status   Specimen Description BLOOD LEFT ARM  Final   Special Requests   Final    BOTTLES DRAWN AEROBIC AND ANAEROBIC Blood Culture adequate volume   Culture NO GROWTH 3 DAYS  Final   Report Status PENDING  Incomplete  Blood Culture (routine x 2)     Status: None (Preliminary result)   Collection Time: 11/14/18 11:00 PM  Result Value Ref Range Status   Specimen Description BLOOD RIGHT ARM  Final   Special Requests   Final    BOTTLES DRAWN AEROBIC AND ANAEROBIC Blood Culture results may not be optimal due to an excessive volume of blood received in culture bottles   Culture NO GROWTH 2 DAYS  Final   Report Status PENDING  Incomplete  MRSA PCR Screening     Status: None   Collection Time: 11/15/18  3:08 AM  Result Value Ref Range Status   MRSA by PCR NEGATIVE NEGATIVE Final    Comment:        The GeneXpert MRSA Assay (FDA approved for NASAL specimens only), is one component of a comprehensive MRSA colonization surveillance program. It is not intended to diagnose MRSA infection nor to guide or monitor treatment for MRSA infections. Performed at [facility], [place] [address], [place], [place] [address]      Labs: BNP (last 3 results) Recent Labs    09/24/18 1934 10/03/18 1421  BNP 195.2* 073.7*   Basic Metabolic Panel: Recent Labs   Lab 11/14/18 2211 11/15/18 0453 11/16/18 0812  NA 135 137 140  K 4.4 4.7 4.1  CL 103 104 108  CO2 22 24 22   GLUCOSE 189* 151* 117*  BUN 34* 34* 37*  CREATININE 2.31* 2.18* 2.12*  CALCIUM 9.0 8.6* 9.4   Liver Function Tests: Recent Labs  Lab 11/14/18 2211  AST 22  ALT 18  ALKPHOS 81  BILITOT 0.6  PROT 6.6  ALBUMIN 2.7*   No results for input(s): LIPASE, AMYLASE in the last 168 hours. No results for input(s): AMMONIA in the last 168 hours. CBC: Recent Labs  Lab 11/14/18 2211 11/15/18 0453 11/16/18 0812 11/17/18 0514  WBC 30.4* 28.8* 12.8* 10.2  NEUTROABS 26.2* 24.6*  --   --   HGB 11.4* 10.4* 13.1 10.0*  HCT 35.3* 32.3* 39.7 31.3*  MCV 95.9 95.6 95.4 95.1  PLT 276 244 285 243   Cardiac Enzymes: No  results for input(s): CKTOTAL, CKMB, CKMBINDEX, TROPONINI in the last 168 hours. BNP: Invalid input(s): POCBNP CBG: Recent Labs  Lab 11/16/18 1226 11/16/18 1642 11/16/18 2107 11/17/18 0615 11/17/18 1222  GLUCAP 217* 173* 133* 141* 155*   D-Dimer No results for input(s): DDIMER in the last 72 hours. Hgb A1c No results for input(s): HGBA1C in the last 72 hours. Lipid Profile No results for input(s): CHOL, HDL, LDLCALC, TRIG, CHOLHDL, LDLDIRECT in the last 72 hours. Thyroid function studies No results for input(s): TSH, T4TOTAL, T3FREE, THYROIDAB in the last 72 hours.  Invalid input(s): FREET3 Anemia work up No results for input(s): VITAMINB12, FOLATE, FERRITIN, TIBC, IRON, RETICCTPCT in the last 72 hours. Urinalysis    Component Value Date/Time   COLORURINE YELLOW 11/14/2018 2320   APPEARANCEUR CLEAR 11/14/2018 2320   LABSPEC 1.014 11/14/2018 2320   PHURINE 5.0 11/14/2018 2320   GLUCOSEU 50 (A) 11/14/2018 2320   HGBUR NEGATIVE 11/14/2018 2320   BILIRUBINUR NEGATIVE 11/14/2018 2320   KETONESUR NEGATIVE 11/14/2018 2320   PROTEINUR 100 (A) 11/14/2018 2320   NITRITE NEGATIVE 11/14/2018 2320   LEUKOCYTESUR NEGATIVE 11/14/2018 2320   Sepsis  Labs Invalid input(s): PROCALCITONIN,  WBC,  LACTICIDVEN Microbiology Recent Results (from the past 240 hour(s))  Blood Culture (routine x 2)     Status: None (Preliminary result)   Collection Time: 11/14/18 10:34 PM  Result Value Ref Range Status   Specimen Description BLOOD LEFT ARM  Final   Special Requests   Final    BOTTLES DRAWN AEROBIC AND ANAEROBIC Blood Culture adequate volume   Culture NO GROWTH 3 DAYS  Final   Report Status PENDING  Incomplete  Blood Culture (routine x 2)     Status: None (Preliminary result)   Collection Time: 11/14/18 11:00 PM  Result Value Ref Range Status   Specimen Description BLOOD RIGHT ARM  Final   Special Requests   Final    BOTTLES DRAWN AEROBIC AND ANAEROBIC Blood Culture results may not be optimal due to an excessive volume of blood received in culture bottles   Culture NO GROWTH 2 DAYS  Final   Report Status PENDING  Incomplete  MRSA PCR Screening     Status: None   Collection Time: 11/15/18  3:08 AM  Result Value Ref Range Status   MRSA by PCR NEGATIVE NEGATIVE Final    Comment:        The GeneXpert MRSA Assay (FDA approved for NASAL specimens only), is one component of a comprehensive MRSA colonization surveillance program. It is not intended to diagnose MRSA infection nor to guide or monitor treatment for MRSA infections. Performed at [facility], [place] [address], [place], [place] [address]      Time coordinating discharge: 40 minutes  SIGNED:   Elmarie Shiley, MD  [facility]

## 2018-11-17 NOTE — Consult Note (Signed)
[place] Nurse wound consult note Patient receiving care in [facility] [phone].  No family at bedside. Patient states the area on the right great toe has been there for a few months. Reason for Consult: Right great toe wound Wound type: Looks like a trauma wound to a callused area on the plantar surface of the toe Measurement: There is a piece of callused tissue that is protruding with a 1 mm open area where the callus tissue is "peeled" up. Wound bed:No real wound bed to be seen. Drainage (amount, consistency, odor) Dried blood Periwound: Callused tissue on toe Dressing procedure/placement/frequency: Cleanse wound on right great toe with saline. Pat dry. Apply mupirocin. Cover with a bandaid. Change twice daily. Monitor the wound area(s) for worsening of condition such as: Signs/symptoms of infection,  Increase in size,  Development of or worsening of odor, Development of pain, or increased pain at the affected locations.  Notify the medical team if any of these develop.  Thank you for the consult.  Discussed plan of care with the patient and bedside nurse.  [place] nurse will not follow at this time.  Please re-consult the [place] team if needed.  Val Riles, RN, MSN, CWOCN, CNS-BC, pager [phone]

## 2018-11-17 NOTE — Care Management Note (Signed)
Case Management Note  Patient Details  Name: Sebastiano Luecke MRN: [id number] Date of Birth: [birth year]  Subjective/Objective:                    Action/Plan: Pt discharging home with self care. Pt has supervision at home and transportation to home.   Expected Discharge Date:  11/17/18               Expected Discharge Plan:  Home/Self Care  In-House Referral:     Discharge planning Services     Post Acute Care Choice:    Choice offered to:     DME Arranged:    DME Agency:     HH Arranged:    HH Agency:     Status of Service:  Completed, signed off  If discussed at [employer] of Stay Meetings, dates discussed:    Additional Comments:  Pollie Friar, RN 11/17/2018, 1:22 PM

## 2018-11-17 NOTE — Consult Note (Signed)
[place] Nurse wound consult note Attempted to see patient in [facility] [phone].  Patient is out for diagnostic study and unavailable to me. Val Riles, RN, MSN, CWOCN, CNS-BC, pager [phone]

## 2018-11-19 LAB — CULTURE, BLOOD (ROUTINE X 2)
Culture: NO GROWTH
Special Requests: ADEQUATE

## 2018-11-20 ENCOUNTER — Ambulatory Visit (HOSPITAL_COMMUNITY)
Admission: RE | Admit: 2018-11-20 | Discharge: 2018-11-20 | Disposition: A | Discharge status: Home / Self Care | Payer: Medicare Other | Source: Ambulatory Visit | Attending: Allergy & Immunology | Admitting: Allergy & Immunology

## 2018-11-20 DIAGNOSIS — D803 Selective deficiency of immunoglobulin G [IgG] subclasses: Secondary | ICD-10-CM | POA: Diagnosis present

## 2018-11-20 LAB — CULTURE, BLOOD (ROUTINE X 2): Culture: NO GROWTH

## 2018-11-20 MED ORDER — ACETAMINOPHEN 325 MG PO TABS
650.0000 mg | ORAL_TABLET | ORAL | Status: DC
  Administered 2018-11-20: 650 mg via ORAL

## 2018-11-20 MED ORDER — LORATADINE 10 MG PO TABS
10.0000 mg | ORAL_TABLET | Freq: Once | ORAL | Status: AC
  Administered 2018-11-20: 10 mg via ORAL

## 2018-11-20 MED ORDER — IMMUNE GLOBULIN (HUMAN) 20 GM/200ML IV SOLN
30.0000 g | INTRAVENOUS | Status: DC
  Administered 2018-11-20: 30 g via INTRAVENOUS
  Filled 2018-11-20: qty 100

## 2018-11-20 MED ORDER — SODIUM CHLORIDE 0.9 % IV SOLN
INTRAVENOUS | Status: DC
  Administered 2018-11-20: 10:00:00 via INTRAVENOUS

## 2018-11-20 MED ORDER — LORATADINE 10 MG PO TABS
ORAL_TABLET | ORAL | Status: AC
  Administered 2018-11-20: 10 mg via ORAL
  Filled 2018-11-20: qty 1

## 2018-11-20 MED ORDER — ACETAMINOPHEN 325 MG PO TABS
ORAL_TABLET | ORAL | Status: AC
  Administered 2018-11-20: 650 mg via ORAL
  Filled 2018-11-20: qty 2

## 2018-11-21 LAB — IGG: IgG (Immunoglobin G), Serum: 700 mg/dL (ref 700–1600)

## 2018-11-23 ENCOUNTER — Ambulatory Visit (INDEPENDENT_AMBULATORY_CARE_PROVIDER_SITE_OTHER): Payer: Medicare Other | Admitting: Podiatry

## 2018-11-23 DIAGNOSIS — L97511 Non-pressure chronic ulcer of other part of right foot limited to breakdown of skin: Secondary | ICD-10-CM | POA: Diagnosis not present

## 2018-11-23 DIAGNOSIS — E1142 Type 2 diabetes mellitus with diabetic polyneuropathy: Secondary | ICD-10-CM

## 2018-11-23 NOTE — Progress Notes (Signed)
He presents today states that he will be moving soon but I would like for me to take another look at the toe.  He is a diabetic and states that he has been taking care of the toe on a regular basis.  Denies fever chills nausea vomiting muscle aches and pains has been recently been in the hospital for upper respiratory and lower respiratory issues.  Objective: Vital signs are stable alert and oriented x3.  Pulses are palpable.  Neurologic storm is diminished per Semmes-Weinstein monofilament ulceration plantar aspect of the forefoot right/hallux right demonstrates once debrided no probing deep to bone.  There is no purulence no malodor measures less than a centimeter in length and is a fissure type ulceration.  Assessment: Diabetic peripheral neuropathy with diabetic ulceration plantar aspect hallux interphalangeal joint.  Plan: Continue Iodosorb and dressed daily continue Darco shoe we will notify him once his diabetic shoes come in.

## 2018-11-24 ENCOUNTER — Telehealth: Payer: Self-pay | Admitting: Podiatry

## 2018-11-24 MED ORDER — CADEXOMER IODINE 0.9 % EX GEL: 1.0000 "application " | Freq: Every day | CUTANEOUS | 0 refills | Status: AC | PRN

## 2018-11-24 NOTE — Telephone Encounter (Addendum)
Unable to leave a message on mobile phone the voicemail box had not been set up. I spoke to pt on home phone and he states Dr. Milinda Pointer was to call in Silvadene and I explained his notes 11/23/2018 stated pt was to continue iodosorb. I told pt I would have Dr. Stephenie Acres CMA A. Prevette call him.

## 2018-11-24 NOTE — Telephone Encounter (Signed)
Patient was suppose to receive anti-biotic for is toe, appointment was yesterday with Dr. Milinda Pointer

## 2018-11-24 NOTE — Addendum Note (Signed)
Addended by: Clovis Riley E on: 11/24/2018 01:45 PM   Modules accepted: Orders

## 2018-11-24 NOTE — Telephone Encounter (Signed)
Spoke to patient's wife-informed her Dr. Milinda Pointer wanted him to use to Iodosorb gel on the wound until next visit. Wife said she did not have any of that and requested a Rx-sent into [facility]

## 2018-11-27 ENCOUNTER — Telehealth: Payer: Self-pay | Admitting: Podiatry

## 2018-11-27 ENCOUNTER — Ambulatory Visit: Payer: Medicare Other | Admitting: Endocrinology

## 2018-11-27 ENCOUNTER — Institutional Professional Consult (permissible substitution): Payer: Medicare Other | Admitting: Pulmonary Disease

## 2018-11-27 MED ORDER — SILVER SULFADIAZINE 1 % EX CREA: 1.0000 "application " | TOPICAL_CREAM | Freq: Every day | CUTANEOUS | 0 refills | Status: AC

## 2018-11-27 NOTE — Telephone Encounter (Signed)
Dr. March Rummage states may switch to Silvadene to affected areas daily. Orders sent to the [facility].

## 2018-11-27 NOTE — Telephone Encounter (Signed)
We received an Rx for iodosorb on Friday from Dr. Milinda Pointer. Pt is allergic to betadine and this could be somewhat related with the iodine in it. Pt was asking about that and asked if we could call and get the Rx changed to something else.

## 2018-11-27 NOTE — Addendum Note (Signed)
Addended by: Harriett Sine D on: 11/27/2018 03:25 PM   Modules accepted: Orders

## 2018-11-29 ENCOUNTER — Telehealth: Payer: Self-pay | Admitting: *Deleted

## 2018-11-29 NOTE — Telephone Encounter (Signed)
-----   Message from Valentina Shaggy, MD sent at 11/23/2018  8:35 PM EST ----- Actually when I talked to his wife when he was hospitalized, I did consider that. I am going to ask Tammy to increase the dose to 40 gm monthly.   Salvatore Marvel, MD [facility] [place] [facility] [place]

## 2018-11-29 NOTE — Telephone Encounter (Signed)
New order for the 40gm every 28 days sent to short stay

## 2018-12-05 ENCOUNTER — Telehealth: Payer: Self-pay | Admitting: Allergy & Immunology

## 2018-12-05 NOTE — Telephone Encounter (Signed)
Called pts wife. States that the patient still has a touch of pneumonia and she does not feel that his recent medication changes are working. States that the patient is not febrile but there have been a lot of sick people where they live. Also states that she is moving to [place]. She request to speak with you directly. Advised the we are in clinic so a nurse may give her a call back with recommendations. Please advise.

## 2018-12-05 NOTE — Telephone Encounter (Signed)
Pt wife called and said that the meds that he change is not helping. He want to talk with you. [phone].

## 2018-12-08 NOTE — Telephone Encounter (Signed)
I called Jeffery [name]'s wife and talk to them for about 20 minutes.  Evidently, they have already moved to [facility], which is closer to their kids.  It was difficult to understand her, but it seems that Jeffery [name] was diagnosed with pneumonia right before he left and is just finishing ciprofloxacin for that.  He also had a "touch of bronchitis".  His wife is wondering whether we need to increase his immunoglobulin a little bit more.  Review of the notes show that his dose was just increased to to 40 gm and he has not even received that elevated dose yet. Last infusion per the EMR was March 2nd (with increase in the dose ordered on March 11th).   After I talked to he and his wife, I called Jeffery [name] and she recommended that they just come up here for his next infusion because transferring the prescription to another location takes 1 to 2 weeks and he probably can even get established with a provider down there that quickly.  Therefore, to avoid any delays in getting the infusion, I would highly encourage them to just get the next infusion here in [place].  In the meantime, we will refer him to see Dr. Adonis Huguenin at [facility] at their [facility] location.   Address: [address] [place], [place] [address]  PZPSU:[phone]  Fax: [phone]  Karena Addison - can you work on this referral?  [place] [facility] - can you call Jeffery [name]'s wife to let them know that it would be best to go ahead and drive up here for the next infusion? Otherwise, his infusion might be several weeks late. This will be his first infusion of the higher dose, therefore hopefully his infection rate will decrease after this.   Salvatore Marvel, MD [facility] [place] [facility] [place]

## 2018-12-08 NOTE — Telephone Encounter (Signed)
Called to inform patient of Dr. Gillermina Hu recommendation, was unable to leave a message due to mailbox not being set up.

## 2018-12-11 NOTE — Telephone Encounter (Signed)
Tam Tam - he cannot make it to [facility] for the next infusion, evidently.   Salvatore Marvel, MD [facility] [place] [facility] [place]

## 2018-12-11 NOTE — Telephone Encounter (Signed)
I spoke with the Referral Coordinator Jinny Blossom and I have faxed his referral to [phone]. They will call the patient.

## 2018-12-11 NOTE — Telephone Encounter (Signed)
Spoke with patient and states that he can not make it to [facility] for his infusion and that he had mention this to Dr. Ernst Bowler. He states that he will have an infusion to be perform on April 6 in his area, but unsure so Dr. Ernst Bowler will need to verify this statement. Patient states he has no transportation to make it up to [facility] to have his infusion done, and wants to know how long it will take before he can get infusions done around [place], [place].

## 2018-12-12 NOTE — Telephone Encounter (Signed)
I have spoken with patient and Dr Ernst Bowler and at this time with all the virus issues at this time I have suggested patient get in home infusions with increased dose for next infusion.  I will set up his infusions with [facility] and they have branch in [place] that will dispense drug.  I will fax new referral

## 2018-12-18 ENCOUNTER — Encounter (HOSPITAL_COMMUNITY): Payer: Medicare Other

## 2019-01-02 ENCOUNTER — Ambulatory Visit: Payer: Medicare Other | Admitting: Allergy & Immunology

## 2019-01-31 ENCOUNTER — Telehealth: Payer: Self-pay | Admitting: *Deleted

## 2019-01-31 NOTE — Telephone Encounter (Signed)
   TELEPHONE CALL NOTE  This patient has been deemed a candidate for follow-up tele-health visit to limit community exposure during the Covid-19 pandemic. I spoke with the patient via phone to discuss instructions.  patient will receive a phone call 2-3 days prior to their E-Visit at which time consent will be verbally confirmed.   A Virtual Office Visit appointment type has been scheduled for 02/05/19 with HARDING "TELEPHONE"  prefers  TELEPHONE type.    Raiford Simmonds, RN 01/31/2019 2:11 PM

## 2019-02-05 ENCOUNTER — Telehealth (INDEPENDENT_AMBULATORY_CARE_PROVIDER_SITE_OTHER): Payer: Medicare Other | Admitting: Cardiology

## 2019-02-05 DIAGNOSIS — I878 Other specified disorders of veins: Secondary | ICD-10-CM

## 2019-02-05 DIAGNOSIS — I82811 Embolism and thrombosis of superficial veins of right lower extremities: Secondary | ICD-10-CM

## 2019-02-05 DIAGNOSIS — Z789 Other specified health status: Secondary | ICD-10-CM | POA: Diagnosis not present

## 2019-02-05 DIAGNOSIS — I10 Essential (primary) hypertension: Secondary | ICD-10-CM

## 2019-02-05 DIAGNOSIS — I5032 Chronic diastolic (congestive) heart failure: Secondary | ICD-10-CM

## 2019-02-05 NOTE — Progress Notes (Signed)
Virtual Visit via Telephone Note   This visit type was conducted due to national recommendations for restrictions regarding the COVID-19 Pandemic (e.g. social distancing) in an effort to limit this patient's exposure and mitigate transmission in our community.  Due to his co-morbid illnesses, this patient is at least at moderate risk for complications without adequate follow up.  This format is felt to be most appropriate for this patient at this time.  The patient did not have access to video technology/had technical difficulties with video requiring transitioning to audio format only (telephone).  All issues noted in this document were discussed and addressed.  No physical exam could be performed with this format.  Please refer to the patient's chart for his  consent to telehealth for [facility].   Patient has given verbal permission to conduct this visit via virtual appointment and to bill insurance 02/06/2019 4:07 PM     Evaluation Performed:  Follow-up visit  Date:  02/06/2019   ID:  Jeffery [name], DOB 29-Apr-1941, MRN [id number]  Patient Location: Home Provider Location: Home  PCP:  Rosina Lowenstein, [place] [facility] Cardiologist:  Glenetta Hew, MD  Electrophysiologist:  None   Chief Complaint: 65-month follow-up  History of Present Illness:    Jeffery [name] is a 78 y.o. male with PMH notable for chronic lower extremity edema with "chronic diastolic CHF" as well as reported paroxysmal A. fib (not seen on prior cardiology evaluation) who presents via audio/video conferencing for a telehealth visit today.  Jeffery [name] was last seen in January of this year for follow-up of chronic "heart failure if anything diastolic as well as leg edema-most likely related to chronic venous stasis).  This was actually a hospital follow-up where he presented with altered mental status likely from UTI.  Had only mild elevation in BNP, took a little extra Lasix and then went  to baseline.  I ordered venous Dopplers on him which did show significant venous reflux, but also confirmed superficial venous thrombosis of the right greater saphenous vein at the level of mid calf.  Because of this, he was declined initial evaluation by vascular surgery until the thrombosis is cleared.  Unfortunately, the follow-up test was not yet done because of the COVID-19 restrictions.  He has been on Xarelto for his history of DVT in the past.  Interval History:  Talking to Jeffery [name] today on the telephone is a very difficult proposition.  Much of the conversation is actually with his wife which is also very difficult.  Jeffery [name] actually seems to be awake and alert with no major complaints.  Swelling seems to be pretty stable with his wraps.  He does have lots of musculoskeletal pains which have limited his ability to walk. He really is not having any symptoms of chest tightness or pressure with rest or exertion (albeit he is not active but the exert himself very much). Despite having edema, he really does not have PND and orthopnea.  Is back to using once daily Lasix.  Cardiovascular ROS: positive for - edema and Certainly would have exertional dyspnea if he were to exert himself. negative for - chest pain, irregular heartbeat, orthopnea, palpitations, paroxysmal nocturnal dyspnea, rapid heart rate or shortness of breath; syncope/near syncope, TIA/amaurosis fugax.  The patient does not have symptoms concerning for COVID-19 infection (fever, chills, cough, or new shortness of breath).  The patient is practicing social distancing.  ROS:  Please see the history of present illness.    Review of  Systems  Constitutional: Positive for malaise/fatigue (Just does not have energy).  HENT: Negative for nosebleeds.   Respiratory: Positive for cough (Mild chronic).   Gastrointestinal: Negative for abdominal pain, blood in stool and heartburn.  Genitourinary: Negative for dysuria, flank pain and hematuria.   Musculoskeletal: Positive for back pain and joint pain. Negative for falls.  Neurological: Positive for dizziness, weakness (Generalized) and headaches.  Psychiatric/Behavioral: Positive for depression and memory loss.     COVID-19 Education: The signs and symptoms of COVID-19 were discussed with the patient and how to seek care for testing (follow up with PCP or arrange E-visit).   The importance of social distancing was discussed today.  The patient his wife had now moved to [facility] to be closer to their daughter.  They are having to establish new physicians in that area.  Past Medical History:  Diagnosis Date  . Angio-edema   . BPH (benign prostatic hyperplasia)   . Cognitive deficits   . COPD (chronic obstructive pulmonary disease) ([place])   . Degeneration of lumbar intervertebral disc   . Diabetes mellitus with neuropathy ([place])   . DVT of axillary vein, chronic, left ([place]) 2017   DVT related to PICC line -PICC line was then placed for long-term IV antibiotics in the setting of osteomyelitis.  Marland Kitchen History of syncope    Hospitalized December 2018. --> EVENT MONITOR: Sinus rhythm with sinus bradycardia, rate lowest 46 bpm.  Minimal PVCs.  Multiple PACs.  No atrial fibrillation.  No arrhythmias.  No indication for pacemaker..  Ambulatory BP recording also done - see HTN  . Hypertension    LABILE: Ambulatory BP monitor 01/2018: Average blood pressure 147/71 mmHg.  Max 180/86 mmHg.  Minimum 106/50 mmHg.  85% of the time, elevated readings noted.  At that time losartan was increased to 100 mg.  . Immunodeficiency syndrome ([place])   . Kidney disease   . Metabolic syndrome    Diabetes, hypertension, obesity  . Obesity (BMI 30.0-34.9)    BMI of 32.8  . Osteoarthritis   . Osteomyelitis ([place]) 2017   Toe  . Paroxysmal atrial fibrillation --by report, NOT confirmed    This was per patient report, but reviewing his prior cardiologist's notes did not comment anything about atrial fibrillation.   . Pituitary neoplasm   . S/P splenectomy   . 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 Sleep apnea    On home oxygen, but not CPAP.  Is pending pulmonary medicine referral  . Venous insufficiency (chronic) (peripheral)     Past Surgical History:  Procedure Laterality Date  . APPENDECTOMY    . GALLBLADDER SURGERY    . HERNIA REPAIR    . LUMBAR DISC SURGERY    . NM MYOVIEW LTD  09/2017   [facility] [place]) -> scanned into Epic: a) September 2016: Normal LV function.  No ischemia or infarction.;; b) January 2019: -EF 65%.  No ischemia or infarction.  LOW RISK.  Mild inferior defect, likely diaphragmatic attenuation  . PAIN PUMP IMPLANTATION    . SPLENECTOMY, TOTAL    . TEMPORAL ARTERY BIOPSY / LIGATION    . TONSILLECTOMY    . TRANSTHORACIC ECHOCARDIOGRAM  09/2017   [facility] [place]) -> scanned into Epic:  a) 05/2015: EF 55 to 60%.  GR 1 DD.  Severe LA dilation.  Aortic sclerosis but no stenosis.;; b) Mild LVH.  EF 55-60%.  GR 1 DD.  Trace mitral and tricuspid  regurgitation.  Normal RA pressures.     Current Meds  Medication Sig  . allopurinol (ZYLOPRIM) 300 MG tablet Take 300 mg by mouth daily.   Marland Kitchen amLODipine (NORVASC) 10 MG tablet Take 10 mg by mouth daily.   . cadexomer iodine (IODOSORB) 0.9 % gel Apply 1 application topically daily as needed for wound care.  . calcitRIOL (ROCALTROL) 0.25 MCG capsule Take 0.25 mcg by mouth daily.   Marland Kitchen donepezil (ARICEPT) 10 MG tablet Take 10 mg by mouth at bedtime.   . finasteride (PROSCAR) 5 MG tablet Take 5 mg by mouth daily.   . Fluticasone-Umeclidin-Vilant (TRELEGY ELLIPTA IN) Inhale 2 puffs into the lungs daily.   . furosemide (LASIX) 20 MG tablet TAKE 20 MG TABLET EVERY DAY , MAY TAKE AN EXTRA 20 MG FOR INCREASE  SWELLING (Patient taking differently: Take 20 mg by mouth daily. )  . gabapentin (NEURONTIN) 300 MG capsule Take 300 mg by mouth 3 (three) times daily.   . Immune  Globulin 10% (IMMUNE GLOBULIN 10%) 10G/150mL (10,000mg /117mL) SOLN every 30 (thirty) days.  . Insulin Glargine (LANTUS SOLOSTAR) 100 UNIT/ML Solostar Pen Inject 15 Units into the skin every morning. (Patient taking differently: Inject 22 Units into the skin every morning. )  . insulin lispro (HUMALOG KWIKPEN) 100 UNIT/ML KwikPen Inject 0.04 mLs (4 Units total) into the skin 3 (three) times daily with meals. And pen needles 4/day  . losartan (COZAAR) 100 MG tablet Take 100 mg by mouth daily.   . montelukast (SINGULAIR) 10 MG tablet Take 10 mg by mouth at bedtime.  . mupirocin ointment (BACTROBAN) 2 % APPLY TO WOUND TWICE DAILY AFTER SOAKING (Patient taking differently: Apply 1 application topically 2 (two) times daily. )  . omeprazole (PRILOSEC) 10 MG capsule Take 10 mg by mouth daily.   Marland Kitchen oxyCODONE (ROXICODONE) 15 MG immediate release tablet Take 15 mg by mouth every 6 (six) hours as needed for pain.  . OXYCONTIN 15 MG 12 hr tablet Take 15 mg by mouth every 12 (twelve) hours.   . silver sulfADIAZINE (SILVADENE) 1 % cream Apply 1 application topically daily.  . tamsulosin (FLOMAX) 0.4 MG CAPS capsule Take 0.4 mg by mouth daily.   Alveda Reasons 15 MG TABS tablet Take 15 mg by mouth daily with supper.      Allergies:   Betadine [povidone iodine]; Keflex [cephalexin]; and Penicillins   Social History   Tobacco Use  . Smoking status: Former Smoker    Last attempt to quit: 06/07/1988    Years since quitting: 30.6  . Smokeless tobacco: Never Used  Substance Use Topics  . Alcohol use: Never    Frequency: Never  . Drug use: Never     Family Hx: The patient's family history includes Diabetes in his brother.   Prior CV studies:   The following studies were reviewed today: . Lower extremity venous Dopplers 10/2018: No right sided DVT.  Indirect evidence of obstruction proximal to the inguinal ligament.  Abnormal reflux times noted in the great saphenous vein at mid thigh.  Also noted to have acute  superficial vein thrombosis in the right saphenous vein at level of mid calf.  No left DVT.  Abnormal reflux times noted in the great saphenous vein at the mid thigh, distal thigh, knee as well as proximal and mid calf.:  Labs/Other Tests and Data Reviewed:    EKG:  No ECG reviewed.  Recent Labs: 10/03/2018: BNP 256.6 11/14/2018: ALT 18 11/16/2018: BUN 37; Creatinine, Ser 2.12;  Potassium 4.1; Sodium 140 11/17/2018: Hemoglobin 10.0; Platelets 243   Recent Lipid Panel No results found for: CHOL, TRIG, HDL, CHOLHDL, LDLCALC, LDLDIRECT  Wt Readings from Last 3 Encounters:  11/20/18 198 lb (89.8 kg)  11/14/18 180 lb (81.6 kg)  10/23/18 200 lb (90.7 kg)     Objective:    Vital Signs:  There were no vitals taken for this visit.  Awake and alert, but very sluggish.  Wife talks most of the visit. Seems to be in no acute distress.  No respiratory distress.  Nonlabored   ASSESSMENT & PLAN:    Problem List Items Addressed This Visit    Chronic diastolic (congestive) heart failure (HCC) (Chronic)    Again, I really do not think this is truly a reason for his symptoms.  Edema is likely related to chronic venous insufficiency and lymphedema.  Minimally elevated BNP levels.  Is now on only taken once daily Lasix.  Swelling seems to be stable.  Continue to recommend support stockings. He is on ARB, but not on beta-blocker because of fatigue. Using amlodipine for additional blood pressure/afterload reduction.      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  Very unusual in a patient who is actively taking Xarelto.  Would probably benefit from follow-up Dopplers once established down in [place].  Is currently on Xarelto which had been initially started due to superficial upper extremity venous thrombus.  I think if there is concern for anemia we should probably be able to stop Xarelto, but  would like to have a reassessment of his upper and lower extremities to see if there is any more thrombus before stopping.  Will defer to his new PCP and potentially new cardiologist/vascular surgeon      Essential hypertension (Chronic)    Has had some labile blood pressures.  Back in December had an episode of neurocardiogenic syncope.  I would not be overly aggressive with blood pressure control.  Is on stable dose of ARB and amlodipine.  No longer on beta-blocker.  Previously have discussed the importance of setting up a home monitoring system such as [employer].      Lower extremity venous stasis-with bilateral edema (Chronic)    He does have pretty significant bilateral lower extremity venous insufficiency, but was turned down for a visit with vascular surgery because of saphenous vein thrombus.  He is on anticoagulation for peripheral vein thrombus.  He was supposed to have follow-up Dopplers done, but is now no longer living locally.  This could be something followed up in the outpatient setting.  Continue to use Ace wrap/compression stockings and elevate feet.  We discussed the foot elevation pillow, but they have not yet ordered.  Would consider referral to vein and vascular once he is established in the [place] area.      No evidence of atrial fibrillation    Again, not confirmed.  Never mentioned by prior cardiologist. Xarelto is due to DVT. We recently weaned off beta-blocker for fatigue.         Jeffery [name] and his wife have moved down to [place], [place] and will likely need to establish care locally there.  He is still being followed by [facility], but will need to be established with specialists locally as well.  I discussed both [facility] and [facility] as local options.  Preferably this will lead to referral to  vascular surgery.  There is concerned about possible anemia, and I think it would be reasonable to consider  stopping Xarelto, however this was not started by me.   COVID-19 Education: The signs and symptoms of COVID-19 were discussed with the patient and how to seek care for testing (follow up with PCP or arrange E-visit).   The importance of social distancing was discussed today.  Time:   Today, I have spent 16 minutes with the patient with telehealth technology discussing the above problems.     Medication Adjustments/Labs and Tests Ordered: Current medicines are reviewed at length with the patient today.  Concerns regarding medicines are outlined above.  Medication Instructions:   If needed PCP is concerned about anemia, would be fine to hold Xarelto as this is been for a superficial vein thrombosis  Tests Ordered: No orders of the defined types were placed in this encounter.   Medication Changes: No orders of the defined types were placed in this encounter.   Disposition:  Follow up as needed.  Would likely need cardiology follow-up with either [facility] or [facility]    Signed, Glenetta Hew, MD  02/06/2019 4:07 PM    [place]

## 2019-02-05 NOTE — Patient Instructions (Addendum)
Medication Instructions:  Your physician recommends that you continue on your current medications as directed. Please refer to the Current Medication list given to you today.  If you need a refill on your cardiac medications before your next appointment, please call your pharmacy.   Lab work: NONE   Testing/Procedures: NONE  Follow-Up: AS NEEDED   OK TO HOLD XARELTO IF YOUR PRIMARY CARE CONCERNED ABOUT ANEMIA UNTIL WORK UP COMPLETED  DR HARDING HAS RECOMMENDED [facility]

## 2019-02-06 ENCOUNTER — Encounter: Payer: Self-pay | Admitting: Cardiology

## 2019-02-06 DIAGNOSIS — I82811 Embolism and thrombosis of superficial veins of right lower extremities: Secondary | ICD-10-CM | POA: Insufficient documentation

## 2019-02-06 NOTE — Assessment & Plan Note (Addendum)
Again, I really do not think this is truly a reason for his symptoms.  Edema is likely related to chronic venous insufficiency and lymphedema.  Minimally elevated BNP levels.  Is now on only taken once daily Lasix.  Swelling seems to be stable.  Continue to recommend support stockings. He is on ARB, but not on beta-blocker because of fatigue. Using amlodipine for additional blood pressure/afterload reduction.

## 2019-02-06 NOTE — Assessment & Plan Note (Signed)
Has had some labile blood pressures.  Back in December had an episode of neurocardiogenic syncope.  I would not be overly aggressive with blood pressure control.  Is on stable dose of ARB and amlodipine.  No longer on beta-blocker.  Previously have discussed the importance of setting up a home monitoring system such as [employer].

## 2019-02-06 NOTE — Assessment & Plan Note (Signed)
Again, not confirmed.  Never mentioned by prior cardiologist. Xarelto is due to DVT. We recently weaned off beta-blocker for fatigue.

## 2019-02-06 NOTE — Assessment & Plan Note (Signed)
He does have pretty significant bilateral lower extremity venous insufficiency, but was turned down for a visit with vascular surgery because of saphenous vein thrombus.  He is on anticoagulation for peripheral vein thrombus.  He was supposed to have follow-up Dopplers done, but is now no longer living locally.  This could be something followed up in the outpatient setting.  Continue to use Ace wrap/compression stockings and elevate feet.  We discussed the foot elevation pillow, but they have not yet ordered.  Would consider referral to vein and vascular once he is established in the [place] area.

## 2019-02-06 NOTE — Assessment & Plan Note (Signed)
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  Very unusual in a patient who is actively taking Xarelto.  Would probably benefit from follow-up Dopplers once established down in [place].  Is currently on Xarelto which had been initially started due to superficial upper extremity venous thrombus.  I think if there is concern for anemia we should probably be able to stop Xarelto, but would like to have a reassessment of his upper and lower extremities to see if there is any more thrombus before stopping.  Will defer to his new PCP and potentially new cardiologist/vascular surgeon

## 2019-02-16 ENCOUNTER — Telehealth: Payer: Self-pay

## 2019-02-16 NOTE — Telephone Encounter (Signed)
LOV 09/27/18. Per Dr. Loanne Drilling, f/u 2 mo. Canceled 11/27/18. Called to reschedule appt. LVM requesting returned call.

## 2020-07-21 DEATH — deceased

## 2020-12-05 IMAGING — DX DG CHEST 2V
2 series · 2 of 2 positions shown · non-contrast
Comparison: Prior radiograph from 09/24/2018.

CLINICAL DATA: Initial evaluation for acute fever, flu symptoms.

EXAM:
CHEST - 2 VIEW

[chest lat]
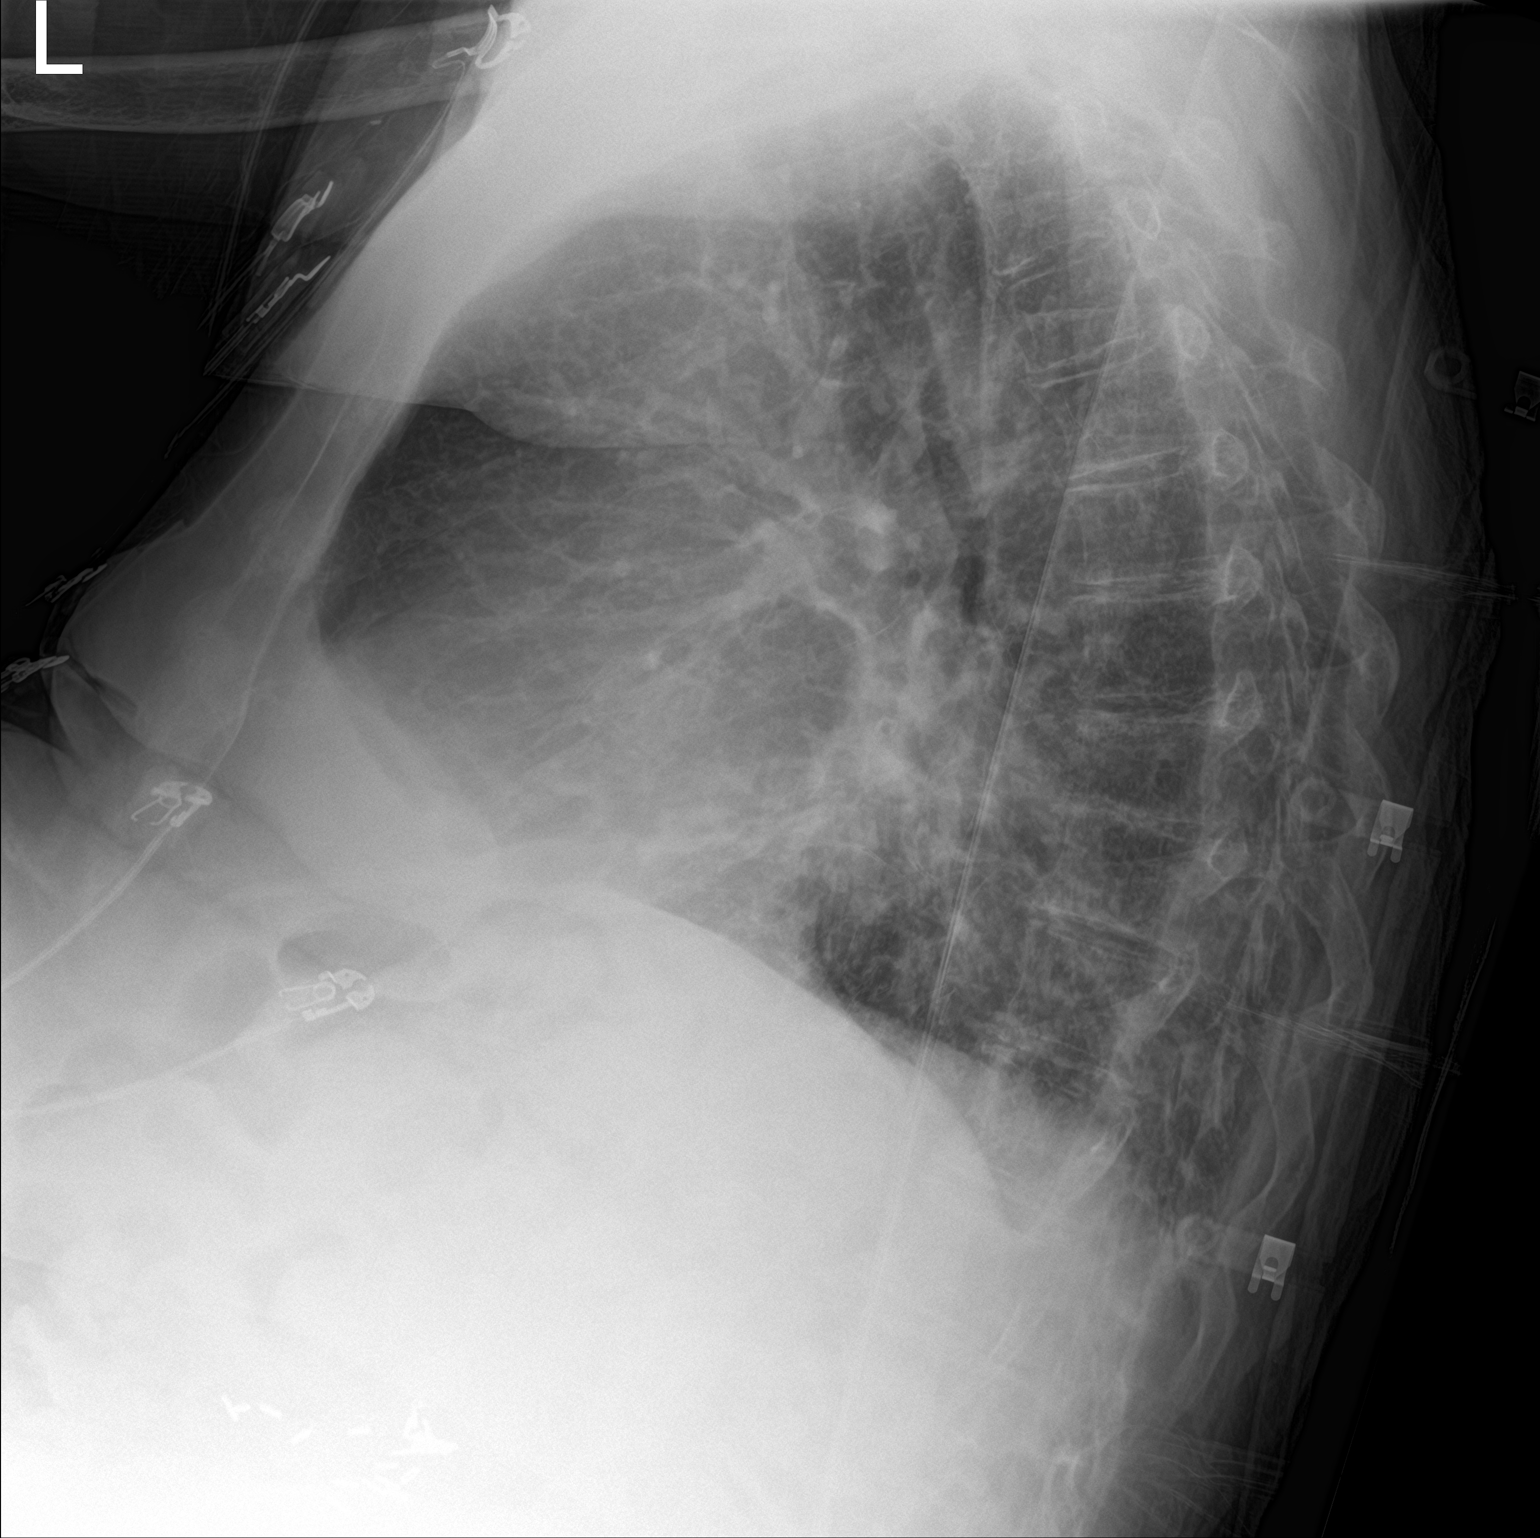

[chest ap]
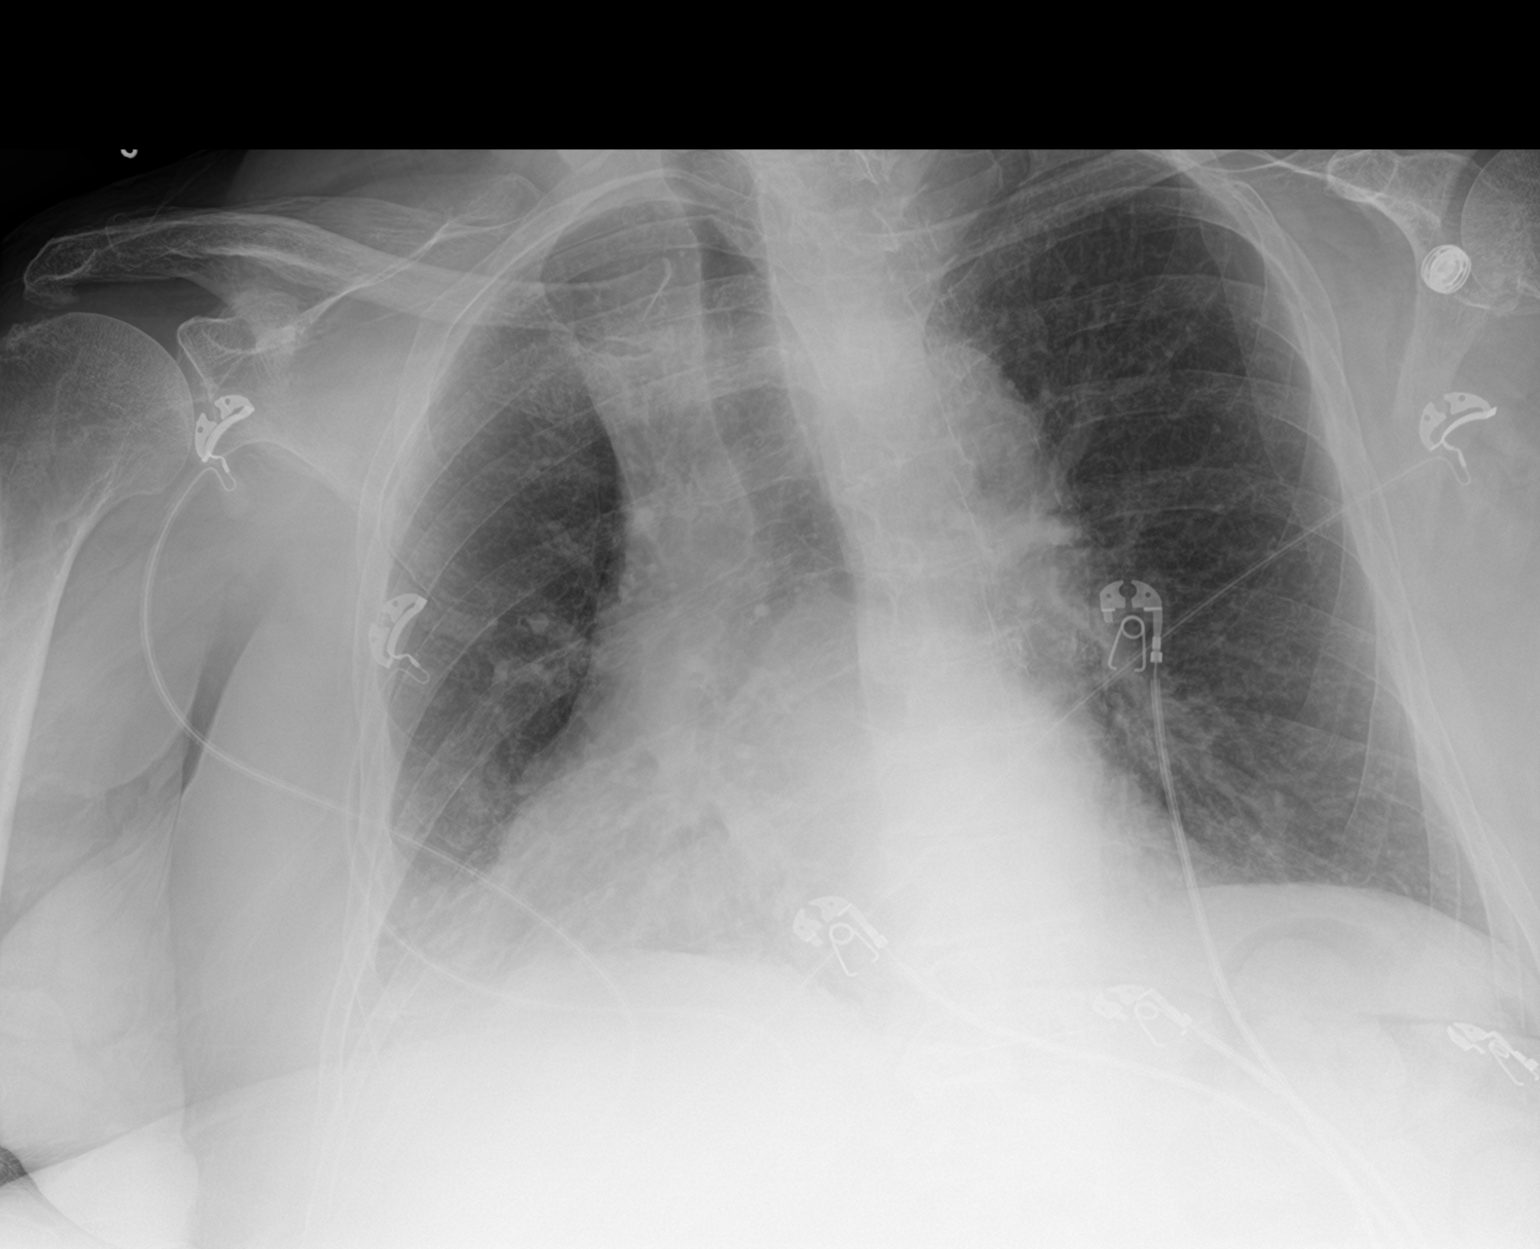

[2 of 2 positions shown; findings below may reference images not displayed]

FINDINGS: Patient is markedly rotated to the right. Cardiomegaly grossly
stable. Mediastinal silhouette within normal limits.

Lungs mildly hypoinflated. Mild diffuse interstitial congestion
without frank pulmonary edema. Superimposed confluent right basilar
opacity could reflect atelectasis or infiltrate. Small right pleural
effusion. No pneumothorax.

No acute osseous finding.
IMPRESSION: 1. Cardiomegaly with mild diffuse pulmonary interstitial congestion
and small right pleural effusion.
2. Associated right basilar opacity favored to reflect atelectasis,
although infiltrate could be considered in the correct clinical
setting.
